# Patient Record
Sex: Female | Born: 1960 | Race: White | Hispanic: No | Marital: Married | Smoking: Never smoker
Health system: Southern US, Community
[De-identification: ages and names within clinical notes are randomized; demographics above are authoritative.]

## PROBLEM LIST (undated history)

## (undated) DIAGNOSIS — M1612 Unilateral primary osteoarthritis, left hip: Secondary | ICD-10-CM

## (undated) DIAGNOSIS — R42 Dizziness and giddiness: Secondary | ICD-10-CM

## (undated) DIAGNOSIS — R112 Nausea with vomiting, unspecified: Secondary | ICD-10-CM

## (undated) DIAGNOSIS — I10 Essential (primary) hypertension: Secondary | ICD-10-CM

## (undated) DIAGNOSIS — Z87442 Personal history of urinary calculi: Secondary | ICD-10-CM

## (undated) DIAGNOSIS — T4145XA Adverse effect of unspecified anesthetic, initial encounter: Secondary | ICD-10-CM

## (undated) DIAGNOSIS — M2341 Loose body in knee, right knee: Secondary | ICD-10-CM

## (undated) DIAGNOSIS — T8859XA Other complications of anesthesia, initial encounter: Secondary | ICD-10-CM

## (undated) DIAGNOSIS — M1990 Unspecified osteoarthritis, unspecified site: Secondary | ICD-10-CM

## (undated) DIAGNOSIS — Z9889 Other specified postprocedural states: Secondary | ICD-10-CM

## (undated) HISTORY — PX: COLONOSCOPY: SHX174

## (undated) HISTORY — PX: TONSILLECTOMY: SUR1361

---

## 1999-06-12 ENCOUNTER — Other Ambulatory Visit: Admission: RE | Admit: 1999-06-12 | Discharge: 1999-06-12 | Payer: Self-pay | Admitting: Obstetrics and Gynecology

## 2000-06-21 ENCOUNTER — Other Ambulatory Visit: Admission: RE | Admit: 2000-06-21 | Discharge: 2000-06-21 | Payer: Self-pay | Admitting: *Deleted

## 2001-07-02 ENCOUNTER — Other Ambulatory Visit: Admission: RE | Admit: 2001-07-02 | Discharge: 2001-07-02 | Payer: Self-pay | Admitting: Obstetrics and Gynecology

## 2002-04-28 ENCOUNTER — Encounter: Admission: RE | Admit: 2002-04-28 | Discharge: 2002-04-28 | Payer: Self-pay | Admitting: Family Medicine

## 2002-04-28 ENCOUNTER — Encounter: Payer: Self-pay | Admitting: Family Medicine

## 2003-04-22 ENCOUNTER — Other Ambulatory Visit: Admission: RE | Admit: 2003-04-22 | Discharge: 2003-04-22 | Payer: Self-pay | Admitting: Obstetrics and Gynecology

## 2005-04-03 ENCOUNTER — Ambulatory Visit: Payer: Self-pay | Admitting: Family Medicine

## 2006-06-15 ENCOUNTER — Encounter (INDEPENDENT_AMBULATORY_CARE_PROVIDER_SITE_OTHER): Payer: Self-pay | Admitting: Internal Medicine

## 2006-06-15 LAB — CONVERTED CEMR LAB: Pap Smear: NORMAL

## 2006-08-22 ENCOUNTER — Ambulatory Visit: Payer: Self-pay | Admitting: Family Medicine

## 2006-08-22 LAB — CONVERTED CEMR LAB
ALT: 27 units/L (ref 0–40)
AST: 26 units/L (ref 0–37)
Albumin: 4.4 g/dL (ref 3.5–5.2)
Alkaline Phosphatase: 58 units/L (ref 39–117)
BUN: 9 mg/dL (ref 6–23)
CO2: 29 meq/L (ref 19–32)
Calcium: 9.5 mg/dL (ref 8.4–10.5)
Chloride: 99 meq/L (ref 96–112)
Creatinine, Ser: 0.9 mg/dL (ref 0.4–1.2)
GFR calc Af Amer: 87 mL/min
GFR calc non Af Amer: 72 mL/min
Glucose, Bld: 86 mg/dL (ref 70–99)
Potassium: 4.1 meq/L (ref 3.5–5.1)
Sodium: 138 meq/L (ref 135–145)
TSH: 2.05 microintl units/mL (ref 0.35–5.50)
Total Bilirubin: 0.9 mg/dL (ref 0.3–1.2)
Total Protein: 7 g/dL (ref 6.0–8.3)

## 2008-02-24 LAB — HM DEXA SCAN

## 2008-03-24 ENCOUNTER — Encounter (INDEPENDENT_AMBULATORY_CARE_PROVIDER_SITE_OTHER): Payer: Self-pay | Admitting: Internal Medicine

## 2008-03-24 DIAGNOSIS — H8110 Benign paroxysmal vertigo, unspecified ear: Secondary | ICD-10-CM

## 2008-03-24 DIAGNOSIS — R42 Dizziness and giddiness: Secondary | ICD-10-CM | POA: Insufficient documentation

## 2008-03-25 ENCOUNTER — Encounter (INDEPENDENT_AMBULATORY_CARE_PROVIDER_SITE_OTHER): Payer: Self-pay | Admitting: Internal Medicine

## 2008-03-25 ENCOUNTER — Ambulatory Visit: Payer: Self-pay | Admitting: Family Medicine

## 2008-03-25 DIAGNOSIS — M064 Inflammatory polyarthropathy: Secondary | ICD-10-CM | POA: Insufficient documentation

## 2008-03-26 LAB — CONVERTED CEMR LAB
BUN: 12 mg/dL (ref 6–23)
Basophils Absolute: 0 10*3/uL (ref 0.0–0.1)
Basophils Relative: 0.3 % (ref 0.0–3.0)
CO2: 28 meq/L (ref 19–32)
Calcium: 9.6 mg/dL (ref 8.4–10.5)
Chloride: 103 meq/L (ref 96–112)
Creatinine, Ser: 0.8 mg/dL (ref 0.4–1.2)
Eosinophils Absolute: 0.2 10*3/uL (ref 0.0–0.7)
Eosinophils Relative: 2.7 % (ref 0.0–5.0)
GFR calc Af Amer: 99 mL/min
GFR calc non Af Amer: 82 mL/min
Glucose, Bld: 78 mg/dL (ref 70–99)
HCT: 39.7 % (ref 36.0–46.0)
Hemoglobin: 13.8 g/dL (ref 12.0–15.0)
Lymphocytes Relative: 22 % (ref 12.0–46.0)
MCHC: 34.8 g/dL (ref 30.0–36.0)
MCV: 91.5 fL (ref 78.0–100.0)
Monocytes Absolute: 0.7 10*3/uL (ref 0.1–1.0)
Monocytes Relative: 11.6 % (ref 3.0–12.0)
Neutro Abs: 3.7 10*3/uL (ref 1.4–7.7)
Neutrophils Relative %: 63.4 % (ref 43.0–77.0)
Platelets: 226 10*3/uL (ref 150–400)
Potassium: 4.5 meq/L (ref 3.5–5.1)
RBC: 4.34 M/uL (ref 3.87–5.11)
RDW: 13.1 % (ref 11.5–14.6)
Rheumatoid fact SerPl-aCnc: <20 intl units/mL — ABNORMAL LOW (ref 0.0–20.0)
Sed Rate: 7 mm/hr (ref 0–22)
Sodium: 137 meq/L (ref 135–145)
WBC: 5.9 10*3/uL (ref 4.5–10.5)

## 2009-04-29 ENCOUNTER — Ambulatory Visit: Payer: Self-pay | Admitting: Internal Medicine

## 2010-01-02 ENCOUNTER — Ambulatory Visit: Payer: Self-pay | Admitting: Internal Medicine

## 2010-04-07 ENCOUNTER — Ambulatory Visit: Payer: Self-pay | Admitting: Family Medicine

## 2010-04-19 ENCOUNTER — Encounter: Payer: Self-pay | Admitting: Family Medicine

## 2010-04-21 LAB — HM MAMMOGRAPHY: HM Mammogram: NORMAL

## 2010-08-15 NOTE — Assessment & Plan Note (Signed)
Summary: BEDSOLE FLU SHOT/RBH   Nurse Visit   Allergies: 1)  ! Sulfa  Immunizations Administered:  Influenza Vaccine # 1:    Vaccine Type: Fluvax 3+    Site: left deltoid    Mfr: GlaxoSmithKline    Dose: 0.5 ml    Route: IM    Given by: Mervin Hack CMA (AAMA)    Exp. Date: 01/13/2011    Lot #: [id number]    VIS given: 02/07/10 version given April 07, 2010.  Flu Vaccine Consent Questions:    Do you have a history of severe allergic reactions to this vaccine? no    Any prior history of allergic reactions to egg and/or gelatin? no    Do you have a sensitivity to the preservative Thimersol? no    Do you have a past history of Guillan-Barre Syndrome? no    Do you currently have an acute febrile illness? no    Have you ever had a severe reaction to latex? no    Vaccine information given and explained to patient? yes    Are you currently pregnant? no  Orders Added: 1)  Flu Vaccine 22yrs + [90658] 2)  Admin 1st Vaccine [14782]

## 2010-08-15 NOTE — Miscellaneous (Signed)
Summary: Tetanus/Td Vaccine   Clinical Lists Changes  Orders: Added new Service order of Tdap => 49yrs IM (16109) - Signed Added new Service order of Admin 1st Vaccine (60454) - Signed Added new Service order of Admin 1st Vaccine [facility]) [phone]) - Signed Observations: Added new observation of TD BOOST VIS: 06/03/07 version given January 02, 2010. (01/02/2010 16:05) Added new observation of TD BOOSTERLO: [id number] (01/02/2010 16:05) Added new observation of TD BOOST EXP: 10/07/2011 (01/02/2010 16:05) Added new observation of TD BOOSTERBY: Tillman Abide, MD (01/02/2010 16:05) Added new observation of TD BOOSTERRT: IM (01/02/2010 16:05) Added new observation of TDBOOSTERDSE: 0.5 ml (01/02/2010 16:05) Added new observation of TD BOOSTERMF: GlaxoSmithKline (01/02/2010 16:05) Added new observation of TD BOOST SIT: left deltoid (01/02/2010 16:05) Added new observation of TD BOOSTER: Tdap (01/02/2010 16:05)      Tetanus/Td Vaccine    Vaccine Type: Tdap    Site: left deltoid    Mfr: GlaxoSmithKline    Dose: 0.5 ml    Route: IM    Given by: Tillman Abide, MD    Exp. Date: 10/07/2011    Lot #: [id number]    VIS given: 06/03/07 version given January 02, 2010.

## 2010-11-14 ENCOUNTER — Encounter: Payer: Self-pay | Admitting: Family Medicine

## 2010-11-15 ENCOUNTER — Institutional Professional Consult (permissible substitution): Payer: Self-pay | Admitting: Internal Medicine

## 2010-11-15 ENCOUNTER — Telehealth: Payer: Self-pay | Admitting: *Deleted

## 2010-11-15 ENCOUNTER — Ambulatory Visit (INDEPENDENT_AMBULATORY_CARE_PROVIDER_SITE_OTHER): Payer: BC Managed Care – PPO | Admitting: Family Medicine

## 2010-11-15 ENCOUNTER — Ambulatory Visit (INDEPENDENT_AMBULATORY_CARE_PROVIDER_SITE_OTHER)
Admission: RE | Admit: 2010-11-15 | Discharge: 2010-11-15 | Disposition: A | Discharge status: Home / Self Care | Payer: BC Managed Care – PPO | Source: Ambulatory Visit | Attending: Family Medicine | Admitting: Family Medicine

## 2010-11-15 ENCOUNTER — Encounter: Payer: Self-pay | Admitting: Family Medicine

## 2010-11-15 DIAGNOSIS — S76019A Strain of muscle, fascia and tendon of unspecified hip, initial encounter: Secondary | ICD-10-CM | POA: Insufficient documentation

## 2010-11-15 DIAGNOSIS — M25559 Pain in unspecified hip: Secondary | ICD-10-CM | POA: Insufficient documentation

## 2010-11-15 DIAGNOSIS — S76219A Strain of adductor muscle, fascia and tendon of unspecified thigh, initial encounter: Secondary | ICD-10-CM | POA: Insufficient documentation

## 2010-11-15 DIAGNOSIS — IMO0002 Reserved for concepts with insufficient information to code with codable children: Secondary | ICD-10-CM

## 2010-11-15 MED ORDER — DICLOFENAC SODIUM 1.5 % TD SOLN: TRANSDERMAL | Status: DC

## 2010-11-15 NOTE — Patient Instructions (Signed)
REFERRAL: GO THE THE FRONT ROOM AT THE ENTRANCE OF OUR CLINIC, NEAR CHECK IN. ASK FOR MARION. SHE WILL HELP YOU SET UP YOUR REFERRAL. DATE: TIME:  Follow-up in 6 weeks.

## 2010-11-15 NOTE — Progress Notes (Signed)
50 year old female: Extremely active, normally very healthy female who presents for evaluation for persistent left-sided anterior hip pain.  Running, had some ITBS, starting in November.  Previously, she was running about 25 miles a week  Went to kinesiology - at the [facility]. [place] - strength equal on anterior and posterior lower extremity testing. No deficits appreciated in strength  Has not run in 6 weeks. Can do ellipitical and bike.  20-25 miles a week 5-10 k, has never completed and a half marathon or marathon  Pain with sleeping.  Sleep on back No numbness Knees are sometimes bothersome, particularly with her prior appendectomy BS, however now her primary pain is at the anterior hip  Ellipitical a few times a week Stair stepper - off for a few months. She did this very hard when she couldn't run when her ITB was flared up  Cannot stretch any more. Some pain. Father has a history of bone cancer.   Has done NSAIDS, Tylenol, attempted to stretch, alter exercises on her own.  Patient Active Problem List  Diagnoses  . MIGRAINE HEADACHE  . BENIGN POSITIONAL VERTIGO  . LICHEN PLANUS  . UNSPECIFIED INFLAMMATORY POLYARTHROPATHY  . VERTIGO, INTERMITTENT  . Groin strain  . Strain of hip flexor  . Hip pain   No past medical history on file. Past Surgical History  Procedure Date  . Ovarian cyst surgery 03-2004    right   History  Substance Use Topics  . Smoking status: Never Smoker   . Smokeless tobacco: Not on file  . Alcohol Use: Not on file   Family History  Problem Relation Age of Onset  . Diabetes Mother   . Goiter Mother   . Colon polyps Father   . Addison's disease Sister    Allergies  Allergen Reactions  . Sulfonamide Derivatives     REACTION: rash   Current Outpatient Prescriptions on File Prior to Visit  Medication Sig Dispense Refill  . calcium carbonate (TUMS - DOSED IN MG ELEMENTAL CALCIUM) 500 MG chewable tablet Chew 1 tablet by mouth 2 (two) times  daily.        . Cholecalciferol (VITAMIN D3) 10000 UNITS capsule Take 10,000 Units by mouth once a week.        . Chondroitin Sulfate A 150 MG CAPS Take 1 capsule by mouth daily.        . flintstones complete (FLINTSTONES) 60 MG chewable tablet Chew 1 tablet by mouth daily.        . Glucosamine HCl-Glucosamin SO4 (GLUCOSAMINE COMPLEX) 200-300 MG TABS Take 1 tablet by mouth daily.        Marland Kitchen ibuprofen (ADVIL,MOTRIN) 200 MG tablet Take 800 mg by mouth every 8 (eight) hours as needed.         REVIEW OF SYSTEMS  GEN: No fevers, chills. Nontoxic. Primarily MSK c/o today. MSK: Detailed in the HPI GI: tolerating PO intake without difficulty Neuro: No numbness, parasthesias, or tingling associated. Otherwise the pertinent positives of the ROS are noted above.   GEN: Well-developed,well-nourished,in no acute distress; alert,appropriate and cooperative throughout examination HEENT: Normocephalic and atraumatic without obvious abnormalities. Ears, externally no deformities PULM: Breathing comfortably in no respiratory distress EXT: No clubbing, cyanosis, or edema PSYCH: Normally interactive. Cooperative during the interview. Pleasant. Friendly and conversant. Not anxious or depressed appearing. Normal, full affect.  HIP EXAM: SIDE: L ROM: Abduction, Flexion, Internal and External range of motion: marginally restricted internal and external rotation. She does have some pain anteriorly  with both terminal motions. This is at her point of maximal tenderness Pain with terminal IROM and EROM: as above.  Anterior hip, flexion insertion, just distal to the ASIS, there is some pain GTB: NT SLR: NEG Knees: No effusion FABER: NT REVERSE FABER: NT, neg Piriformis: NT at direct palpation Str: flexion: 4/5, true flexion and with hip turned out 4/5 - pain abduction: 5/5 adduction: 5/5  A/P: hip pain, hip abductor strain, and hip abductor strain. More true overuse tendinopathy of multiple compartments of the  hip flexure and hip abductor region.  Probable insertional tendinopathy.  At this point, we reviewed plan of care. We'll initiate physical therapy consult. Directed home exercise program. Continue oral anti-inflammatories and NSAID topical. Recheck in 6 weeks

## 2010-11-15 NOTE — Telephone Encounter (Signed)
Prior auth needed for pennsaid, form is on your desk.

## 2010-11-16 NOTE — Telephone Encounter (Signed)
Form faxed to [facility].

## 2010-11-17 NOTE — Telephone Encounter (Signed)
Prior auth given for pennsaid, approval letter signed by Dr. Patsy Lager, placed up front for scanning.

## 2011-04-26 ENCOUNTER — Ambulatory Visit (INDEPENDENT_AMBULATORY_CARE_PROVIDER_SITE_OTHER): Payer: BC Managed Care – PPO

## 2011-04-26 DIAGNOSIS — Z23 Encounter for immunization: Secondary | ICD-10-CM

## 2011-06-04 ENCOUNTER — Encounter: Payer: Self-pay | Admitting: Internal Medicine

## 2011-10-22 ENCOUNTER — Other Ambulatory Visit: Payer: Self-pay | Admitting: Family Medicine

## 2011-10-22 MED ORDER — DICLOFENAC SODIUM 1.5 % TD SOLN: TRANSDERMAL | Status: DC

## 2011-10-23 ENCOUNTER — Telehealth: Payer: Self-pay

## 2011-10-23 NOTE — Telephone Encounter (Signed)
Will do!

## 2011-10-23 NOTE — Telephone Encounter (Signed)
Received faxed from from [facility] requesting prior auth for Pennsaid drops. Form from [employer] [facility] is in your in box to complete and fax back.

## 2011-10-25 NOTE — Telephone Encounter (Signed)
Completed PA request faxed to [phone]. Approval letter received and faxed to [facility] and is on Dr [employer] desk for signature then sent to be scanned.

## 2012-01-01 ENCOUNTER — Encounter: Payer: Self-pay | Admitting: Family Medicine

## 2012-01-01 ENCOUNTER — Ambulatory Visit (INDEPENDENT_AMBULATORY_CARE_PROVIDER_SITE_OTHER): Payer: BC Managed Care – PPO | Admitting: Family Medicine

## 2012-01-01 VITALS — BP 140/100 | HR 64 | Temp 97.9°F | Wt 134.0 lb

## 2012-01-01 DIAGNOSIS — R030 Elevated blood-pressure reading, without diagnosis of hypertension: Secondary | ICD-10-CM

## 2012-01-01 DIAGNOSIS — I10 Essential (primary) hypertension: Secondary | ICD-10-CM | POA: Insufficient documentation

## 2012-01-01 LAB — CBC WITH DIFFERENTIAL/PLATELET
Basophils Relative: 0.5 % (ref 0.0–3.0)
Eosinophils Absolute: 0.1 10*3/uL (ref 0.0–0.7)
Eosinophils Relative: 2.6 % (ref 0.0–5.0)
HCT: 37.8 % (ref 36.0–46.0)
Lymphs Abs: 1.4 10*3/uL (ref 0.7–4.0)
MCHC: 33.8 g/dL (ref 30.0–36.0)
MCV: 91.8 fl (ref 78.0–100.0)
Monocytes Absolute: 0.6 10*3/uL (ref 0.1–1.0)
Neutrophils Relative %: 61.2 % (ref 43.0–77.0)
Platelets: 221 10*3/uL (ref 150.0–400.0)
RBC: 4.12 Mil/uL (ref 3.87–5.11)
WBC: 5.7 10*3/uL (ref 4.5–10.5)

## 2012-01-01 LAB — COMPREHENSIVE METABOLIC PANEL
ALT: 14 U/L (ref 0–35)
AST: 23 U/L (ref 0–37)
Albumin: 4.2 g/dL (ref 3.5–5.2)
Alkaline Phosphatase: 44 U/L (ref 39–117)
BUN: 10 mg/dL (ref 6–23)
Calcium: 9 mg/dL (ref 8.4–10.5)
Chloride: 95 mEq/L — ABNORMAL LOW (ref 96–112)
Potassium: 3.7 mEq/L (ref 3.5–5.1)

## 2012-01-01 LAB — POCT URINALYSIS DIPSTICK
Ketones, UA: NEGATIVE
Protein, UA: NEGATIVE
Spec Grav, UA: <=1.005
Urobilinogen, UA: NEGATIVE
pH, UA: 7.5

## 2012-01-01 MED ORDER — HYDROCHLOROTHIAZIDE 25 MG PO TABS: 25.0000 mg | ORAL_TABLET | Freq: Every day | ORAL | Status: DC

## 2012-01-01 NOTE — Progress Notes (Signed)
  Subjective:    Patient ID: Sharon Riddle, female    DOB: 05-10-1961, 51 y.o.   MRN: [id number]  HPI  New onset blood pressure elevations: Ever since taking diclofenac cream TID (10/2011) she has noted BPs elevated at home.  She first noted this 1 week ago.. Saw 6 lb weight gain from fluid. Hand swelling and fluid under eyes. BP was 160/100. Mild dull headache. In past she had same reaction to vioxx before it was taken of the market.   BP has decreased some since being off diclofenac pain.  Feels well otherwise. No abdominal, No N/V. No chest pain, no SOB.  No family history of HTN, heart disease. Mother with DM type 1, no  Big kidney issues.   Review of Systems  Constitutional: Negative for fever and fatigue.  HENT: Negative for ear pain.   Eyes: Negative for pain.  Respiratory: Negative for chest tightness and shortness of breath.   Cardiovascular: Negative for chest pain, palpitations and leg swelling.  Gastrointestinal: Negative for abdominal pain.  Genitourinary: Negative for dysuria.  Musculoskeletal: Positive for joint swelling and arthralgias.  Neurological: Positive for headaches. Negative for syncope.  Psychiatric/Behavioral: The patient is not nervous/anxious.        Objective:   Physical Exam  Constitutional: Vital signs are normal. She appears well-developed and well-nourished. She is cooperative.  Non-toxic appearance. She does not appear ill. No distress.  HENT:  Head: Normocephalic.  Right Ear: Hearing, tympanic membrane, external ear and ear canal normal. Tympanic membrane is not erythematous, not retracted and not bulging.  Left Ear: Hearing, tympanic membrane, external ear and ear canal normal. Tympanic membrane is not erythematous, not retracted and not bulging.  Nose: No mucosal edema or rhinorrhea. Right sinus exhibits no maxillary sinus tenderness and no frontal sinus tenderness. Left sinus exhibits no maxillary sinus tenderness and no frontal sinus  tenderness.  Mouth/Throat: Uvula is midline, oropharynx is clear and moist and mucous membranes are normal.  Eyes: Conjunctivae, EOM and lids are normal. Pupils are equal, round, and reactive to light. No foreign bodies found.  Fundoscopic exam:      The right eye shows no arteriolar narrowing, no AV nicking and no papilledema.       The left eye shows no arteriolar narrowing, no AV nicking and no papilledema.  Neck: Trachea normal and normal range of motion. Neck supple. Carotid bruit is not present. No mass and no thyromegaly present.  Cardiovascular: Normal rate, regular rhythm, S1 normal, S2 normal, normal heart sounds, intact distal pulses and normal pulses.  Exam reveals no gallop and no friction rub.   No murmur heard. Pulmonary/Chest: Effort normal and breath sounds normal. Not tachypneic. No respiratory distress. She has no decreased breath sounds. She has no wheezes. She has no rhonchi. She has no rales.  Abdominal: Soft. Normal appearance and bowel sounds are normal. There is no tenderness.  Neurological: She is alert.  Skin: Skin is warm, dry and intact. No rash noted.  Psychiatric: Her speech is normal and behavior is normal. Judgment and thought content normal. Her mood appears not anxious. Cognition and memory are normal. She does not exhibit a depressed mood.          Assessment & Plan:

## 2012-01-01 NOTE — Assessment & Plan Note (Signed)
Likely due to reaction to diclofenac topical.  EKG nml.  UA clear.  Normal physical exam except diffuse swelling. No suggestion from symptomatology of pheochromocytoma. Will eval with labs including liver, kidney, thyroid check to rule out other secondary causes. No evidence of end organ damage.  In meantime, will start HCTZ to help with swelling and to lower BP. May be able to wean off of this the further she is from stopping diclofenac.

## 2012-01-01 NOTE — Patient Instructions (Addendum)
Start HCTZ daily in mornings. Continue to follow BPs at home. Call if remaining elevated despite medication. We will call you with lab results. If normal we will give more time for possible reaction to diclofenac to resolve, can wean down off HCTZ if able. Go to ER if chest pain or severe shortness of breath.

## 2012-01-03 ENCOUNTER — Ambulatory Visit: Payer: BC Managed Care – PPO | Admitting: Family Medicine

## 2012-04-03 ENCOUNTER — Ambulatory Visit (INDEPENDENT_AMBULATORY_CARE_PROVIDER_SITE_OTHER)
Admission: RE | Admit: 2012-04-03 | Discharge: 2012-04-03 | Disposition: A | Discharge status: Home / Self Care | Payer: BC Managed Care – PPO | Source: Ambulatory Visit | Attending: Family Medicine | Admitting: Family Medicine

## 2012-04-03 ENCOUNTER — Ambulatory Visit (INDEPENDENT_AMBULATORY_CARE_PROVIDER_SITE_OTHER): Payer: BC Managed Care – PPO | Admitting: Family Medicine

## 2012-04-03 ENCOUNTER — Ambulatory Visit (HOSPITAL_COMMUNITY): Payer: BC Managed Care – PPO

## 2012-04-03 ENCOUNTER — Encounter: Payer: Self-pay | Admitting: Family Medicine

## 2012-04-03 VITALS — BP 130/90 | HR 72 | Temp 98.7°F | Ht 65.0 in | Wt 131.5 lb

## 2012-04-03 DIAGNOSIS — M2390 Unspecified internal derangement of unspecified knee: Secondary | ICD-10-CM

## 2012-04-03 DIAGNOSIS — M25561 Pain in right knee: Secondary | ICD-10-CM

## 2012-04-03 DIAGNOSIS — M9320 Osteochondritis dissecans of unspecified site: Secondary | ICD-10-CM

## 2012-04-03 DIAGNOSIS — M2340 Loose body in knee, unspecified knee: Secondary | ICD-10-CM

## 2012-04-03 DIAGNOSIS — M93269 Osteochondritis dissecans, unspecified knee: Secondary | ICD-10-CM

## 2012-04-03 DIAGNOSIS — Z23 Encounter for immunization: Secondary | ICD-10-CM

## 2012-04-03 DIAGNOSIS — M25569 Pain in unspecified knee: Secondary | ICD-10-CM

## 2012-04-03 NOTE — Addendum Note (Signed)
Addended by: Hannah Beat on: 04/03/2012 02:32 PM   Modules accepted: Orders

## 2012-04-03 NOTE — Progress Notes (Signed)
Nature conservation officer at [facility] [address] [place] [address] Phone: [phone] Fax: [phone]  Date:  04/03/2012   Name:  Sharon Riddle   DOB:  Jul 08, 1961   MRN:  [id number] Gender: female Age: 51 y.o.  PCP:  Kerby Nora, MD    Chief Complaint: Knee Pain   History of Present Illness:  Sharon Riddle is a 51 y.o. very pleasant female patient who presents with the following:  Very pleasant patient who I know well who presents with right-sided knee pain that has been ongoing intermittently and worsening over approaching the last 3 decades, however her symptoms significantly changed about 9 months ago when she had what she describes as a locking up of her joint that lasted for approximately 6 months. She was unable to fully extend her knee and lack at least 10-15 of extension at the time. 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 She describes a medial dislocation.  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  While on a trip, she also sustained an injury where her knee will struck and she twisted her right knee as well.  She does have some intermittent swelling.  51 yo knee, had an arthroscopy with a significant injury at work. ITB recovered, and christmas   Was locked in her right knee, but was able to move it after many months.  Has been dislocating. Stretch a lot at night, and kneecap will ive out. Cannot really give out. Every step. Hinged -- brace.   Past Medical History, Surgical History, Social History, Family History, Problem List, Medications, and Allergies have been reviewed and updated if relevant.  Current Outpatient Prescriptions on File Prior to Visit  Medication Sig Dispense Refill  . aspirin 81 MG tablet Take 81 mg by mouth daily.      . calcium carbonate (TUMS - DOSED IN MG  ELEMENTAL CALCIUM) 500 MG chewable tablet Chew 1 tablet by mouth 2 (two) times daily.        . Cholecalciferol (VITAMIN D3) 10000 UNITS capsule Take 10,000 Units by mouth once a week.        . Chondroitin Sulfate A 150 MG CAPS Take 1 capsule by mouth daily.        . Glucosamine HCl-Glucosamin SO4 (GLUCOSAMINE COMPLEX) 200-300 MG TABS Take 1 tablet by mouth daily.        . hydrochlorothiazide (HYDRODIURIL) 25 MG tablet Take 1 tablet (25 mg total) by mouth daily.  30 tablet  3    Review of Systems:  GEN: No fevers, chills. Nontoxic. Primarily MSK c/o today. MSK: Detailed in the HPI GI: tolerating PO intake without difficulty Neuro: No numbness, parasthesias, or tingling associated. Otherwise the pertinent positives of the ROS are noted above.    Physical Examination: Filed Vitals:   04/03/12 0856  BP: 130/90  Pulse: 72  Temp: 98.7 F (37.1 C)   Filed Vitals:   04/03/12 0856  Height: 5\' 5"  (1.651 m)  Weight: 131 lb 8 oz (59.648 kg)   Body mass index is 21.88 kg/(m^2). Ideal Body Weight: Weight in (lb) to have BMI = 25: 149.9    GEN: WDWN, NAD, Non-toxic, Alert & Oriented x 3 HEENT: Atraumatic, Normocephalic.  Ears and Nose: No external deformity. EXTR: No clubbing/cyanosis/edema NEURO: Normal gait.  PSYCH: Normally  interactive. Conversant. Not depressed or anxious appearing.  Calm demeanor.   Right knee: Lacks 4 of extension. Flexion to 118. Patellar crepitus is noted. Mild pain with loading the patellar facet joints. Stable to varus and valgus stress. Lachman appears negative on my exam. There is some mild anterior translation on anterior drawer compared to the left. Flexion pinch is positive. McMurray's test is positive.  Assessment and Plan:   1. Internal derangement of knee    2. Knee pain, right  DG Knee Bilateral Standing AP, DG Knee 1-2 Views Right, MR Knee Right Wo Contrast   Clinical concern for occult internal derangement of the knee, loose body, or meniscal  tear that is causing mechanical symptoms at locking up of the knee. Obtain an MRI without contrast to evaluate for above. Abnormal x-rays as well as positive McMurray's test and positive flexion pinch testing.  X-rays: AP Bilateral Weight-bearing, Weightbearing Lateral, Sunrise views Indication: knee pain Findings: Notable mild to moderate patellofemoral joint arthritic changes. Joint spaces are fairly well preserved on the weight-bearing AP views. All lateral view, there is a radiopaque structure posteriorly that could represent a loose body. There also appears to be a [place].  Orders Today:  Orders Placed This Encounter  Procedures  . DG Knee Bilateral Standing AP    Standing Status: Future     Number of Occurrences: 1     Standing Expiration Date: 06/03/2013    Order Specific Question:  Preferred imaging location?    Answer:  [facility]    Order Specific Question:  Reason for exam:    Answer:  knee pain  . DG Knee 1-2 Views Right    Standing Status: Future     Number of Occurrences: 1     Standing Expiration Date: 06/03/2013    Order Specific Question:  Preferred imaging location?    Answer:  [facility]    Order Specific Question:  Reason for exam:    Answer:  knee pain  . MR Knee Right Wo Contrast    Standing Status: Future     Number of Occurrences:      Standing Expiration Date: 06/03/2013    Order Specific Question:  Does the patient have a pacemaker, internal devices, implants, aneury    Answer:  No    Order Specific Question:  Preferred imaging location?    Answer:  [facility]    Order Specific Question:  Reason for exam:    Answer:  knee pain, worry for meniscal pathology vs. loose body    Updated Medication List: (Includes new medications, updates to list, dose adjustments) Outpatient Encounter Prescriptions as of 04/03/2012  Medication Sig Dispense Refill  . aspirin 81 MG tablet Take 81 mg by mouth daily.      . calcium carbonate (TUMS  - DOSED IN MG ELEMENTAL CALCIUM) 500 MG chewable tablet Chew 1 tablet by mouth 2 (two) times daily.        . Cholecalciferol (VITAMIN D3) 10000 UNITS capsule Take 10,000 Units by mouth once a week.        . Chondroitin Sulfate A 150 MG CAPS Take 1 capsule by mouth daily.        . Glucosamine HCl-Glucosamin SO4 (GLUCOSAMINE COMPLEX) 200-300 MG TABS Take 1 tablet by mouth daily.        . hydrochlorothiazide (HYDRODIURIL) 25 MG tablet Take 1 tablet (25 mg total) by mouth daily.  30 tablet  3    Medications Discontinued: There are no discontinued  medications.   Hannah Beat, MD

## 2012-04-03 NOTE — Patient Instructions (Addendum)
REFERRAL: GO THE THE FRONT ROOM AT THE ENTRANCE OF OUR CLINIC, NEAR CHECK IN. ASK FOR MARION. SHE WILL HELP YOU SET UP YOUR REFERRAL. DATE: TIME:  

## 2012-04-10 ENCOUNTER — Other Ambulatory Visit (HOSPITAL_COMMUNITY): Payer: Self-pay | Admitting: Orthopedic Surgery

## 2012-04-10 DIAGNOSIS — M2340 Loose body in knee, unspecified knee: Secondary | ICD-10-CM

## 2012-04-12 ENCOUNTER — Ambulatory Visit (HOSPITAL_COMMUNITY)
Admission: RE | Admit: 2012-04-12 | Discharge: 2012-04-12 | Disposition: A | Discharge status: Home / Self Care | Payer: BC Managed Care – PPO | Source: Ambulatory Visit | Attending: Orthopedic Surgery | Admitting: Orthopedic Surgery

## 2012-04-12 DIAGNOSIS — M2340 Loose body in knee, unspecified knee: Secondary | ICD-10-CM

## 2012-04-12 DIAGNOSIS — IMO0002 Reserved for concepts with insufficient information to code with codable children: Secondary | ICD-10-CM | POA: Insufficient documentation

## 2012-04-12 DIAGNOSIS — M25469 Effusion, unspecified knee: Secondary | ICD-10-CM | POA: Insufficient documentation

## 2012-04-12 DIAGNOSIS — M1710 Unilateral primary osteoarthritis, unspecified knee: Secondary | ICD-10-CM | POA: Insufficient documentation

## 2012-04-12 DIAGNOSIS — M25569 Pain in unspecified knee: Secondary | ICD-10-CM | POA: Insufficient documentation

## 2012-04-12 DIAGNOSIS — M7120 Synovial cyst of popliteal space [Baker], unspecified knee: Secondary | ICD-10-CM | POA: Insufficient documentation

## 2012-04-16 ENCOUNTER — Encounter (HOSPITAL_BASED_OUTPATIENT_CLINIC_OR_DEPARTMENT_OTHER): Payer: Self-pay | Admitting: *Deleted

## 2012-04-17 ENCOUNTER — Other Ambulatory Visit (INDEPENDENT_AMBULATORY_CARE_PROVIDER_SITE_OTHER): Payer: BC Managed Care – PPO

## 2012-04-17 DIAGNOSIS — Z01818 Encounter for other preprocedural examination: Secondary | ICD-10-CM

## 2012-04-17 LAB — BASIC METABOLIC PANEL
CO2: 28 mEq/L (ref 19–32)
Chloride: 96 mEq/L (ref 96–112)
Creatinine, Ser: 0.7 mg/dL (ref 0.4–1.2)

## 2012-04-18 ENCOUNTER — Encounter (HOSPITAL_BASED_OUTPATIENT_CLINIC_OR_DEPARTMENT_OTHER): Payer: Self-pay

## 2012-04-18 ENCOUNTER — Ambulatory Visit (HOSPITAL_BASED_OUTPATIENT_CLINIC_OR_DEPARTMENT_OTHER): Payer: BC Managed Care – PPO | Admitting: Anesthesiology

## 2012-04-18 ENCOUNTER — Encounter (HOSPITAL_BASED_OUTPATIENT_CLINIC_OR_DEPARTMENT_OTHER): Payer: Self-pay | Admitting: Anesthesiology

## 2012-04-18 ENCOUNTER — Ambulatory Visit (HOSPITAL_BASED_OUTPATIENT_CLINIC_OR_DEPARTMENT_OTHER)
Admission: RE | Admit: 2012-04-18 | Discharge: 2012-04-18 | Disposition: A | Discharge status: Home / Self Care | Payer: BC Managed Care – PPO | Source: Ambulatory Visit | Attending: Orthopedic Surgery | Admitting: Orthopedic Surgery

## 2012-04-18 ENCOUNTER — Encounter (HOSPITAL_BASED_OUTPATIENT_CLINIC_OR_DEPARTMENT_OTHER)
Admission: RE | Disposition: A | Discharge status: Home / Self Care | Payer: Self-pay | Source: Ambulatory Visit | Attending: Orthopedic Surgery

## 2012-04-18 DIAGNOSIS — I10 Essential (primary) hypertension: Secondary | ICD-10-CM | POA: Insufficient documentation

## 2012-04-18 DIAGNOSIS — M2341 Loose body in knee, right knee: Secondary | ICD-10-CM

## 2012-04-18 DIAGNOSIS — M2340 Loose body in knee, unspecified knee: Secondary | ICD-10-CM | POA: Insufficient documentation

## 2012-04-18 HISTORY — PX: KNEE ARTHROSCOPY: SHX127

## 2012-04-18 HISTORY — DX: Unspecified osteoarthritis, unspecified site: M19.90

## 2012-04-18 HISTORY — DX: Essential (primary) hypertension: I10

## 2012-04-18 HISTORY — DX: Loose body in knee, right knee: M23.41

## 2012-04-18 SURGERY — ARTHROSCOPY, KNEE
Anesthesia: General | Site: Knee | Laterality: Right | Wound class: Clean

## 2012-04-18 MED ORDER — SCOPOLAMINE 1 MG/3DAYS TD PT72
1.0000 | MEDICATED_PATCH | TRANSDERMAL | Status: DC
  Administered 2012-04-18: 1.5 mg via TRANSDERMAL

## 2012-04-18 MED ORDER — PROPOFOL 10 MG/ML IV BOLUS
INTRAVENOUS | Status: DC | PRN
  Administered 2012-04-18: 150 mg via INTRAVENOUS
  Administered 2012-04-18: 30 mg via INTRAVENOUS

## 2012-04-18 MED ORDER — ACETAMINOPHEN 10 MG/ML IV SOLN
1000.0000 mg | Freq: Once | INTRAVENOUS | Status: AC
  Administered 2012-04-18 (×2): 1000 mg via INTRAVENOUS

## 2012-04-18 MED ORDER — GLYCOPYRROLATE 0.2 MG/ML IJ SOLN
INTRAMUSCULAR | Status: DC | PRN
  Administered 2012-04-18: 0.2 mg via INTRAVENOUS

## 2012-04-18 MED ORDER — CEFAZOLIN SODIUM-DEXTROSE 2-3 GM-% IV SOLR
INTRAVENOUS | Status: DC | PRN
  Administered 2012-04-18: 2 g via INTRAVENOUS

## 2012-04-18 MED ORDER — MIDAZOLAM HCL 5 MG/5ML IJ SOLN
INTRAMUSCULAR | Status: DC | PRN
  Administered 2012-04-18: 2 mg via INTRAVENOUS

## 2012-04-18 MED ORDER — METHOCARBAMOL 500 MG PO TABS: 500.0000 mg | ORAL_TABLET | Freq: Four times a day (QID) | ORAL | Status: DC

## 2012-04-18 MED ORDER — HYDRALAZINE HCL 20 MG/ML IJ SOLN
INTRAMUSCULAR | Status: DC | PRN
  Administered 2012-04-18: 5 mg via INTRAVENOUS

## 2012-04-18 MED ORDER — OXYCODONE-ACETAMINOPHEN 5-325 MG PO TABS: 1.0000 | ORAL_TABLET | Freq: Four times a day (QID) | ORAL | Status: DC | PRN

## 2012-04-18 MED ORDER — DEXAMETHASONE SODIUM PHOSPHATE 4 MG/ML IJ SOLN
INTRAMUSCULAR | Status: DC | PRN
  Administered 2012-04-18: 10 mg via INTRAVENOUS

## 2012-04-18 MED ORDER — PROMETHAZINE HCL 25 MG/ML IJ SOLN
6.2500 mg | Freq: Four times a day (QID) | INTRAMUSCULAR | Status: DC | PRN
  Administered 2012-04-18: 6.25 mg via INTRAVENOUS

## 2012-04-18 MED ORDER — PROMETHAZINE HCL 25 MG PO TABS: 25.0000 mg | ORAL_TABLET | Freq: Four times a day (QID) | ORAL | Status: DC | PRN

## 2012-04-18 MED ORDER — BUPIVACAINE HCL (PF) 0.5 % IJ SOLN
INTRAMUSCULAR | Status: DC | PRN
  Administered 2012-04-18: 20 mL

## 2012-04-18 MED ORDER — LACTATED RINGERS IV SOLN
INTRAVENOUS | Status: DC
  Administered 2012-04-18: 08:00:00 via INTRAVENOUS
  Administered 2012-04-18: 20 mL/h via INTRAVENOUS

## 2012-04-18 MED ORDER — ONDANSETRON HCL 4 MG/2ML IJ SOLN
INTRAMUSCULAR | Status: DC | PRN
  Administered 2012-04-18: 4 mg via INTRAVENOUS

## 2012-04-18 MED ORDER — FENTANYL CITRATE 0.05 MG/ML IJ SOLN
INTRAMUSCULAR | Status: DC | PRN
  Administered 2012-04-18: 100 ug via INTRAVENOUS
  Administered 2012-04-18: 50 ug via INTRAVENOUS

## 2012-04-18 MED ORDER — SODIUM CHLORIDE 0.9 % IR SOLN
Status: DC | PRN
  Administered 2012-04-18: 9000 mL

## 2012-04-18 MED ORDER — LIDOCAINE HCL (CARDIAC) 20 MG/ML IV SOLN
INTRAVENOUS | Status: DC | PRN
  Administered 2012-04-18: 60 mg via INTRAVENOUS

## 2012-04-18 SURGICAL SUPPLY — 52 items
APL SKNCLS STERI-STRIP NONHPOA (GAUZE/BANDAGES/DRESSINGS) ×1
BANDAGE ELASTIC 6 VELCRO ST LF (GAUZE/BANDAGES/DRESSINGS) ×2 IMPLANT
BANDAGE ESMARK 6X9 LF (GAUZE/BANDAGES/DRESSINGS) IMPLANT
BENZOIN TINCTURE PRP APPL 2/3 (GAUZE/BANDAGES/DRESSINGS) ×2 IMPLANT
BLADE CUDA 5.5 (BLADE) IMPLANT
BLADE CUDA GRT WHITE 3.5 (BLADE) IMPLANT
BLADE CUDA SHAVER 3.5 (BLADE) IMPLANT
BLADE CUTTER GATOR 3.5 (BLADE) ×2 IMPLANT
BLADE CUTTER MENIS 5.5 (BLADE) IMPLANT
BLADE GREAT WHITE 4.2 (BLADE) IMPLANT
BLADE SURG 15 STRL LF DISP TIS (BLADE) IMPLANT
BLADE SURG 15 STRL SS (BLADE) ×2
BNDG CMPR 9X6 STRL LF SNTH (GAUZE/BANDAGES/DRESSINGS)
BNDG ESMARK 6X9 LF (GAUZE/BANDAGES/DRESSINGS)
BUR OVAL 4.0 (BURR) ×1 IMPLANT
CANISTER OMNI JUG 16 LITER (MISCELLANEOUS) ×2 IMPLANT
CANISTER SUCTION 2500CC (MISCELLANEOUS) IMPLANT
CLOTH BEACON ORANGE TIMEOUT ST (SAFETY) ×2 IMPLANT
CUFF TOURNIQUET SINGLE 34IN LL (TOURNIQUET CUFF) IMPLANT
CUTTER KNOT PUSHER 2-0 FIBERWI (INSTRUMENTS) IMPLANT
CUTTER MENISCUS  4.2MM (BLADE)
CUTTER MENISCUS 4.2MM (BLADE) IMPLANT
DRAPE ARTHROSCOPY W/POUCH 90 (DRAPES) ×2 IMPLANT
DURAPREP 26ML APPLICATOR (WOUND CARE) ×2 IMPLANT
ELECT MENISCUS 165MM 90D (ELECTRODE) IMPLANT
ELECT REM PT RETURN 9FT ADLT (ELECTROSURGICAL)
ELECTRODE REM PT RTRN 9FT ADLT (ELECTROSURGICAL) IMPLANT
GLOVE BIO SURGEON STRL SZ7 (GLOVE) ×1 IMPLANT
GLOVE BIO SURGEON STRL SZ8 (GLOVE) ×2 IMPLANT
GLOVE BIOGEL PI IND STRL 8 (GLOVE) ×2 IMPLANT
GLOVE BIOGEL PI INDICATOR 8 (GLOVE) ×2
GLOVE INDICATOR 7.0 STRL GRN (GLOVE) ×1 IMPLANT
GLOVE ORTHO TXT STRL SZ7.5 (GLOVE) ×2 IMPLANT
GOWN BRE IMP PREV XXLGXLNG (GOWN DISPOSABLE) ×4 IMPLANT
GOWN PREVENTION PLUS XLARGE (GOWN DISPOSABLE) ×1 IMPLANT
HOLDER KNEE FOAM BLUE (MISCELLANEOUS) ×2 IMPLANT
KNEE WRAP E Z 3 GEL PACK (MISCELLANEOUS) ×1 IMPLANT
NDL MENISCAL REPAIR W/EYELT (NEEDLE) IMPLANT
NEEDLE MENISCAL REPAIR DBL ARM (NEEDLE) IMPLANT
NEEDLE MENISCAL REPAIR W/EYELT (NEEDLE) IMPLANT
PACK ARTHROSCOPY DSU (CUSTOM PROCEDURE TRAY) ×2 IMPLANT
PACK BASIN DAY SURGERY FS (CUSTOM PROCEDURE TRAY) ×2 IMPLANT
PENCIL BUTTON HOLSTER BLD 10FT (ELECTRODE) IMPLANT
SET ARTHROSCOPY TUBING (MISCELLANEOUS) ×2
SET ARTHROSCOPY TUBING LN (MISCELLANEOUS) ×1 IMPLANT
SLEEVE SCD COMPRESS KNEE MED (MISCELLANEOUS) IMPLANT
SPONGE GAUZE 4X4 12PLY (GAUZE/BANDAGES/DRESSINGS) ×2 IMPLANT
STRIP CLOSURE SKIN 1/2X4 (GAUZE/BANDAGES/DRESSINGS) ×2 IMPLANT
SUT MNCRL AB 4-0 PS2 18 (SUTURE) IMPLANT
TOWEL OR 17X24 6PK STRL BLUE (TOWEL DISPOSABLE) ×2 IMPLANT
WAND STAR VAC 90 (SURGICAL WAND) ×1 IMPLANT
WATER STERILE IRR 1000ML POUR (IV SOLUTION) ×2 IMPLANT

## 2012-04-18 NOTE — Anesthesia Preprocedure Evaluation (Signed)
Anesthesia Evaluation  Patient identified by MRN, date of birth, ID band Patient awake    Reviewed: Allergy & Precautions, H&P , NPO status , Patient's Chart, lab work & pertinent test results  Airway Mallampati: II TM Distance: >3 FB Neck ROM: Full    Dental No notable dental hx. (+) Teeth Intact and Dental Advisory Given   Pulmonary neg pulmonary ROS,  breath sounds clear to auscultation  Pulmonary exam normal       Cardiovascular hypertension, On Medications Rhythm:Regular Rate:Normal     Neuro/Psych negative neurological ROS  negative psych ROS   GI/Hepatic negative GI ROS, Neg liver ROS,   Endo/Other  negative endocrine ROS  Renal/GU negative Renal ROS  negative genitourinary   Musculoskeletal   Abdominal   Peds  Hematology negative hematology ROS (+)   Anesthesia Other Findings   Reproductive/Obstetrics negative OB ROS                           Anesthesia Physical Anesthesia Plan  ASA: II  Anesthesia Plan: General   Post-op Pain Management:    Induction: Intravenous  Airway Management Planned: LMA  Additional Equipment:   Intra-op Plan:   Post-operative Plan: Extubation in OR  Informed Consent: I have reviewed the patients History and Physical, chart, labs and discussed the procedure including the risks, benefits and alternatives for the proposed anesthesia with the patient or authorized representative who has indicated his/her understanding and acceptance.   Dental advisory given  Plan Discussed with: CRNA and Surgeon  Anesthesia Plan Comments:         Anesthesia Quick Evaluation  

## 2012-04-18 NOTE — Transfer of Care (Signed)
Immediate Anesthesia Transfer of Care Note  Patient: Sharon Riddle  Procedure(s) Performed: * No procedures listed *  Patient Location: PACU  Anesthesia Type: General  Level of Consciousness: awake, alert  and oriented  Airway & Oxygen Therapy: Patient Spontanous Breathing  Post-op Assessment: Report given to PACU RN  Post vital signs: Reviewed and stable  Complications: No apparent anesthesia complications

## 2012-04-18 NOTE — Anesthesia Postprocedure Evaluation (Signed)
  Anesthesia Post-op Note  Patient: Sharon Riddle  Procedure(s) Performed: Procedure(s) (LRB) with comments: ARTHROSCOPY KNEE (Right) - Removal of Loose Bodies-2  Patient Location: PACU  Anesthesia Type: General  Level of Consciousness: awake, alert  and oriented  Airway and Oxygen Therapy: Patient Spontanous Breathing  Post-op Pain: mild  Post-op Assessment: Post-op Vital signs reviewed, Patient's Cardiovascular Status Stable, Respiratory Function Stable, Patent Airway and No signs of Nausea or vomiting  Post-op Vital Signs: Reviewed and stable  Complications: No apparent anesthesia complications

## 2012-04-18 NOTE — Op Note (Signed)
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  ANESTHESIA:   General  PREOPERATIVE INDICATIONS:  Sharon Riddle is a  51 y.o. female with a diagnosis of loose body right knee who failed conservative measures and elected for surgical management.  She has substantial mechanical symptoms and wanted to get back to an active lifestyle, including running.  The risks benefits and alternatives were discussed with the patient preoperatively including but not limited to the risks of infection, bleeding, nerve injury, cardiopulmonary complications, the need for revision surgery, among others, and the patient was willing to proceed. We'll see discussed the risks that she may have progression of arthritis, incomplete relief of knee pain, recurrent loose bodies, the need for conversion to arthroplasty, among others.  OPERATIVE IMPLANTS: None  OPERATIVE FINDINGS: There were 2 large loose bodies, one measuring 2.2 cm x 1.5 cm, the other measuring 1 cm x 1 cm. There was extensive grade 4 chondral loss of the patellofemoral joint with bone-on-bone articulation. The medial and lateral compartments had minimal degenerative changes. There was a small amount of fraying of the lateral meniscus which was debrided. There is also osteophyte formation closing down the notch, but the anterior cruciate ligament and PCL were intact.  OPERATIVE PROCEDURE: The patient is brought to the operating room and placed in the supine position. General anesthesia was administered. IV antibiotics were given.  The right lower extremity was prepped and draped in usual sterile fashion. Examination under anesthesia demonstrated intact Lachman maneuver, and normal stability to varus and valgus stress.  Diagnostic arthroscopy demonstrated extensive grade 4 chondral loss of the patellofemoral joint. There is also abnormality of the distal pole of the patella, although this was not a loose body.  The lateral compartment had some grade 2 tibial changes, and a small area of grade 3 changes. The lateral meniscus was slightly frayed, and was debrided. The medial meniscus was intact as was the medial femoral condyle and tibial condyle.  After completion of the diagnostic arthroscopy, I had to use a bur in order to get to the posterior compartment, burring off the posterior medial aspect of the femoral condyle bone spur, which then gave me access to the posterior medial knee. Once in the back I used a combination of the 30 scope as well as the 70 scope to identify a total of 2 loose bodies. I placed a posterior medial portal using blunt dissection and then placed a metal cannula, and an arthroscopic grasping instrument, to remove both pieces. I inspected the lateral posterior compartment as well, and did not see any signs of loose bodies. Once I completed the loose body removal, remove the arthroscopic instruments, and drained the knee and repaired the portals with Monocryl followed by Steri-Strips and sterile gauze. She was awakened and returned to the PACU in stable and satisfactory condition. There were no complications.

## 2012-04-18 NOTE — Anesthesia Procedure Notes (Signed)
Procedure Name: LMA Insertion Date/Time: 04/18/2012 8:56 AM Performed by: Maris Berger T Pre-anesthesia Checklist: Patient identified, Emergency Drugs available, Suction available and Patient being monitored Patient Re-evaluated:Patient Re-evaluated prior to inductionOxygen Delivery Method: Circle System Utilized Preoxygenation: Pre-oxygenation with 100% oxygen Intubation Type: IV induction Ventilation: Mask ventilation without difficulty LMA: LMA inserted LMA Size: 4.0 Number of attempts: 1 Placement Confirmation: positive ETCO2 Dental Injury: Teeth and Oropharynx as per pre-operative assessment  Comments: Gauze roll between teeth

## 2012-04-18 NOTE — H&P (Signed)
PREOPERATIVE H&P  Chief Complaint: loose body right knee  HPI: Sharon Riddle is a 51 y.o. female who presents for preoperative history and physical with a diagnosis of loose body right knee. Symptoms are rated as moderate to severe, and have been worsening.  This is significantly impairing activities of daily living.  She has elected for surgical management. She has known degenerative disease in her knee, and had a long history of knee problems. Her symptoms are primarily mechanical. Relief from the locking sensations within her knee.  Past Medical History  Diagnosis Date  . Arthritis   . Hypertension     NSAID related   Past Surgical History  Procedure Date  . Ovarian cyst surgery 03-2004    right  . Tonsillectomy    History   Social History  . Marital Status: Married    Spouse Name: N/A    Number of Children: 2  . 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 Smoking status: Never Smoker   . Smokeless tobacco: None  . Alcohol Use: Yes     social  . Drug Use: No  . Sexually Active: None   Other Topics Concern  . None   Social History Narrative  . None   Family History  Problem Relation Age of Onset  . Diabetes Mother   . Goiter Mother   . Colon polyps Father   . Addison's disease Sister    Allergies  Allergen Reactions  . Nsaids     GI upset, elevates blood pressure  . Pennsaid (Diclofenac Sodium)     Elevates blood pressure  . Sulfonamide Derivatives     REACTION: rash   Prior to Admission medications   Medication Sig Start Date End Date Taking? Authorizing Provider  aspirin 81 MG tablet Take 81 mg by mouth daily.   Yes Historical Provider, MD  calcium carbonate (TUMS - DOSED IN MG ELEMENTAL CALCIUM) 500 MG chewable tablet Chew 1 tablet by mouth 2 (two) times daily.     Yes Historical Provider, MD  Cholecalciferol (VITAMIN D3) 10000 UNITS capsule Take 10,000 Units by mouth once a week.      Yes Historical Provider, MD  Chondroitin Sulfate A 150 MG CAPS Take 1 capsule by mouth daily.     Yes Historical Provider, MD  Glucosamine HCl-Glucosamin SO4 (GLUCOSAMINE COMPLEX) 200-300 MG TABS Take 1 tablet by mouth daily.     Yes Historical Provider, MD  hydrochlorothiazide (HYDRODIURIL) 25 MG tablet Take 1 tablet (25 mg total) by mouth daily. 01/01/12 12/31/12 Yes Amy Michelle Nasuti, MD     Positive ROS: All other systems have been reviewed and were otherwise negative with the exception of those mentioned in the HPI and as above.  Physical Exam: General: Alert, no acute distress Cardiovascular: No pedal edema Respiratory: No cyanosis, no use of accessory musculature GI: No organomegaly, abdomen is soft and non-tender Skin: No lesions in the area of chief complaint Neurologic: Sensation intact distally Psychiatric: Patient is competent for consent with normal mood and affect Lymphatic: No axillary or cervical lymphadenopathy  MUSCULOSKELETAL: Right knee has range of motion from 10 to 90, and is stable to varus and valgus stress.  Assessment: loose body right knee, early degenerative arthritis  Plan: Plan for Procedure(s): ARTHROSCOPY KNEE, loose body removal  The risks benefits and alternatives were discussed with the patient including but not limited to the risks of nonoperative treatment, versus  surgical intervention including infection, bleeding, nerve injury,  blood clots, cardiopulmonary complications, morbidity, mortality, among others, and they were willing to proceed. We've also discussed limitations of arthroscopy in the face of arthritis, however her symptoms are primarily mechanical. We will therefore plan to proceed with loose body removal and debridement.  Elinora Weigand P, MD Cell [phone] Pager [phone]  04/18/2012 8:34 AM

## 2012-04-21 ENCOUNTER — Encounter (HOSPITAL_BASED_OUTPATIENT_CLINIC_OR_DEPARTMENT_OTHER): Payer: Self-pay | Admitting: Orthopedic Surgery

## 2012-04-21 ENCOUNTER — Encounter: Payer: Self-pay | Admitting: *Deleted

## 2012-04-24 ENCOUNTER — Other Ambulatory Visit: Payer: Self-pay | Admitting: Family Medicine

## 2012-08-01 ENCOUNTER — Encounter: Payer: Self-pay | Admitting: Family Medicine

## 2012-08-01 ENCOUNTER — Ambulatory Visit (INDEPENDENT_AMBULATORY_CARE_PROVIDER_SITE_OTHER): Payer: BC Managed Care – PPO | Admitting: Family Medicine

## 2012-08-01 VITALS — BP 120/90 | HR 90 | Temp 98.4°F | Ht 65.0 in | Wt 132.8 lb

## 2012-08-01 DIAGNOSIS — Z0000 Encounter for general adult medical examination without abnormal findings: Secondary | ICD-10-CM

## 2012-08-01 DIAGNOSIS — R030 Elevated blood-pressure reading, without diagnosis of hypertension: Secondary | ICD-10-CM

## 2012-08-01 MED ORDER — LOSARTAN POTASSIUM-HCTZ 50-12.5 MG PO TABS: 1.0000 | ORAL_TABLET | Freq: Every day | ORAL | Status: DC

## 2012-08-01 NOTE — Progress Notes (Signed)
Subjective:    Patient ID: Sharon Riddle, female    DOB: 1960/10/07, 52 y.o.   MRN: [id number]  HPI The patient is here for annual wellness exam and preventative care.    She is doing well overall.  Hx of NSAID associated BP elevations: Today BP elevated... Diastolic at 90.  She is taking HCTZ daily. At home 110-120/85-90. Knee arthroscopy 10/4 B hip flexor tendonitis,recent recurrence of this. She has been taking ibuprofen... When she does BP goes up to 100 diastolic. She needs to be on NSAID to get hip issue to resolve so needs more BP control.  Interested in losartan HCTZ. No CP with exertion, minimal edema, no SOB.  Lifting weights, running when no hip, knee issue.  Heathy eating habits.   Wt Readings from Last 3 Encounters:  08/01/12 132 lb 12 oz (60.215 kg)  04/16/12 132 lb (59.875 kg)  04/16/12 132 lb (59.875 kg)      Due for lab eval.   Review of Systems  Constitutional: Negative for fever, fatigue and unexpected weight change.  HENT: Negative for ear pain, congestion, sore throat, sneezing, trouble swallowing and sinus pressure.   Eyes: Negative for pain and itching.  Respiratory: Negative for cough, shortness of breath and wheezing.   Cardiovascular: Negative for chest pain, palpitations and leg swelling.  Gastrointestinal: Negative for nausea, abdominal pain, diarrhea, constipation and blood in stool.  Genitourinary: Negative for dysuria, hematuria, vaginal discharge, difficulty urinating and menstrual problem.  Musculoskeletal: Positive for arthralgias.  Skin: Negative for rash.  Neurological: Negative for syncope, weakness, light-headedness, numbness and headaches.  Psychiatric/Behavioral: Negative for confusion and dysphoric mood. The patient is not nervous/anxious.        Objective:   Physical Exam  Constitutional: Vital signs are normal. She appears well-developed and well-nourished. She is cooperative.  Non-toxic appearance. She does not appear ill.  No distress.  HENT:  Head: Normocephalic.  Right Ear: Hearing, tympanic membrane, external ear and ear canal normal.  Left Ear: Hearing, tympanic membrane, external ear and ear canal normal.  Nose: Nose normal.  Eyes: Conjunctivae normal, EOM and lids are normal. Pupils are equal, round, and reactive to light. No foreign bodies found.  Neck: Trachea normal and normal range of motion. Neck supple. Carotid bruit is not present. No mass and no thyromegaly present.  Cardiovascular: Normal rate, regular rhythm, S1 normal, S2 normal, normal heart sounds and intact distal pulses.  Exam reveals no gallop.   No murmur heard. Pulmonary/Chest: Effort normal and breath sounds normal. No respiratory distress. She has no wheezes. She has no rhonchi. She has no rales.  Abdominal: Soft. Normal appearance and bowel sounds are normal. She exhibits no distension, no fluid wave, no abdominal bruit and no mass. There is no hepatosplenomegaly. There is no tenderness. There is no rebound, no guarding and no CVA tenderness. No hernia.  Lymphadenopathy:    She has no cervical adenopathy.    She has no axillary adenopathy.  Neurological: She is alert. She has normal strength. No cranial nerve deficit or sensory deficit.  Skin: Skin is warm, dry and intact. No rash noted.  Psychiatric: Her speech is normal and behavior is normal. Judgment normal. Her mood appears not anxious. Cognition and memory are normal. She does not exhibit a depressed mood.          Assessment & Plan:  The patient's preventative maintenance and recommended screening tests for an annual wellness exam were reviewed in full today. Brought up to date  unless services declined.  Counselled on the importance of diet, exercise, and its role in overall health and mortality. The patient's FH and SH was reviewed, including their home life, tobacco status, and drug and alcohol status.   Vaccine: uptodate with Tdap ad Flu Colon: Dr. Juanda Chance,  colonoscopy age 34 given cancer risk in family... Repeat 10 years.  Mammo: Schedule  PAP/DVE: See GYN next week.  Nonsmoker  DEXA:  Borderline nml, 2012.Sharon Riddle take vit D high dose in past.. 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

## 2012-08-01 NOTE — Patient Instructions (Addendum)
Return for fasting labs in 7-10 days after starting BP med.  Start losartan HCTZ. Follow BP at home. If running greater than 140/90 call for increase of losartan HCTZ to 100/25 mg. Continue weight bearing exercise and healthy eating.

## 2012-08-01 NOTE — Assessment & Plan Note (Signed)
She needs to take NSAIDs for hip flexor tendonitis... Need to initiate better BP control. Start losartan HCTZ. Check creatinine in 7-10 days.

## 2012-08-19 ENCOUNTER — Other Ambulatory Visit (INDEPENDENT_AMBULATORY_CARE_PROVIDER_SITE_OTHER): Payer: BC Managed Care – PPO

## 2012-08-19 DIAGNOSIS — R030 Elevated blood-pressure reading, without diagnosis of hypertension: Secondary | ICD-10-CM

## 2012-08-20 LAB — COMPREHENSIVE METABOLIC PANEL
BUN: 10 mg/dL (ref 6–23)
CO2: 28 mEq/L (ref 19–32)
Calcium: 9 mg/dL (ref 8.4–10.5)
Chloride: 99 mEq/L (ref 96–112)
Creatinine, Ser: 0.6 mg/dL (ref 0.4–1.2)
GFR: 107.63 mL/min (ref >60.00)

## 2012-08-20 LAB — LIPID PANEL
Cholesterol: 183 mg/dL (ref 0–200)
HDL: 59 mg/dL (ref >39.00)
Triglycerides: 50 mg/dL (ref 0.0–149.0)

## 2012-09-26 ENCOUNTER — Telehealth: Payer: Self-pay | Admitting: *Deleted

## 2012-09-26 MED ORDER — BENZONATATE 200 MG PO CAPS: 200.0000 mg | ORAL_CAPSULE | Freq: Three times a day (TID) | ORAL | Status: DC | PRN

## 2012-09-26 NOTE — Telephone Encounter (Signed)
Sent in rx.

## 2012-09-26 NOTE — Telephone Encounter (Signed)
Patient husband came by and patient has bad cold and she has been taken the over the counter cough medication and it is not helping her. Patient also can not take the narcotic medication b/c they make her sick. He is requesting tessalon perles sent to [facility].

## 2013-03-25 ENCOUNTER — Ambulatory Visit (INDEPENDENT_AMBULATORY_CARE_PROVIDER_SITE_OTHER): Payer: BC Managed Care – PPO

## 2013-03-25 DIAGNOSIS — Z23 Encounter for immunization: Secondary | ICD-10-CM

## 2013-07-11 ENCOUNTER — Other Ambulatory Visit: Payer: Self-pay | Admitting: Family Medicine

## 2013-07-21 ENCOUNTER — Ambulatory Visit (INDEPENDENT_AMBULATORY_CARE_PROVIDER_SITE_OTHER): Payer: BC Managed Care – PPO | Admitting: Sports Medicine

## 2013-07-21 ENCOUNTER — Encounter: Payer: Self-pay | Admitting: Sports Medicine

## 2013-07-21 ENCOUNTER — Ambulatory Visit
Admission: RE | Admit: 2013-07-21 | Discharge: 2013-07-21 | Disposition: A | Discharge status: Home / Self Care | Payer: BC Managed Care – PPO | Source: Ambulatory Visit | Attending: Sports Medicine | Admitting: Sports Medicine

## 2013-07-21 VITALS — BP 134/86 | Ht 65.0 in | Wt 129.0 lb

## 2013-07-21 DIAGNOSIS — M545 Low back pain, unspecified: Secondary | ICD-10-CM

## 2013-07-21 DIAGNOSIS — Q760 Spina bifida occulta: Secondary | ICD-10-CM

## 2013-07-21 DIAGNOSIS — M5137 Other intervertebral disc degeneration, lumbosacral region: Secondary | ICD-10-CM

## 2013-07-21 DIAGNOSIS — M5136 Other intervertebral disc degeneration, lumbar region: Secondary | ICD-10-CM

## 2013-07-23 DIAGNOSIS — Q760 Spina bifida occulta: Secondary | ICD-10-CM | POA: Insufficient documentation

## 2013-07-23 HISTORY — DX: Spina bifida occulta: Q76.0

## 2013-07-23 NOTE — Assessment & Plan Note (Signed)
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

## 2013-07-23 NOTE — Assessment & Plan Note (Signed)
I believe her hip Sxs are triggered by the lumbar spine  Significant change at L4/5 but this may not be responsible for all her sxs  I think we shojld check MRI  Then review best management options

## 2013-07-23 NOTE — Patient Instructions (Signed)
PRIOR AUTHORIZATION NUMBER FOR MRI IS [id number]

## 2013-07-23 NOTE — Progress Notes (Signed)
Patient ID: Sharon Riddle, female   DOB: May 08, 1961, 53 y.o.   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

## 2013-07-25 ENCOUNTER — Ambulatory Visit (HOSPITAL_BASED_OUTPATIENT_CLINIC_OR_DEPARTMENT_OTHER)
Admission: RE | Admit: 2013-07-25 | Discharge: 2013-07-25 | Disposition: A | Discharge status: Home / Self Care | Payer: BC Managed Care – PPO | Source: Ambulatory Visit | Attending: Sports Medicine | Admitting: Sports Medicine

## 2013-07-25 DIAGNOSIS — M545 Low back pain, unspecified: Secondary | ICD-10-CM

## 2013-07-25 DIAGNOSIS — M5030 Other cervical disc degeneration, unspecified cervical region: Secondary | ICD-10-CM | POA: Insufficient documentation

## 2013-07-25 DIAGNOSIS — M5126 Other intervertebral disc displacement, lumbar region: Secondary | ICD-10-CM | POA: Insufficient documentation

## 2013-07-27 ENCOUNTER — Other Ambulatory Visit: Payer: Self-pay | Admitting: *Deleted

## 2013-07-27 MED ORDER — GABAPENTIN 300 MG PO CAPS: ORAL_CAPSULE | ORAL | Status: DC

## 2013-07-29 ENCOUNTER — Ambulatory Visit: Payer: BC Managed Care – PPO | Admitting: Sports Medicine

## 2013-08-12 ENCOUNTER — Encounter: Payer: Self-pay | Admitting: Family Medicine

## 2013-08-18 ENCOUNTER — Encounter: Payer: Self-pay | Admitting: Family Medicine

## 2013-09-28 ENCOUNTER — Other Ambulatory Visit: Payer: Self-pay | Admitting: Sports Medicine

## 2013-09-29 ENCOUNTER — Ambulatory Visit: Payer: BC Managed Care – PPO | Admitting: Sports Medicine

## 2013-10-19 ENCOUNTER — Other Ambulatory Visit: Payer: Self-pay | Admitting: Family Medicine

## 2013-10-22 ENCOUNTER — Ambulatory Visit: Payer: BC Managed Care – PPO | Admitting: Sports Medicine

## 2013-10-27 ENCOUNTER — Encounter: Payer: Self-pay | Admitting: Sports Medicine

## 2013-10-27 ENCOUNTER — Ambulatory Visit (INDEPENDENT_AMBULATORY_CARE_PROVIDER_SITE_OTHER): Payer: BC Managed Care – PPO | Admitting: Sports Medicine

## 2013-10-27 VITALS — BP 132/90 | HR 70 | Ht 65.0 in | Wt 129.0 lb

## 2013-10-27 DIAGNOSIS — M5136 Other intervertebral disc degeneration, lumbar region: Secondary | ICD-10-CM

## 2013-10-27 DIAGNOSIS — M5137 Other intervertebral disc degeneration, lumbosacral region: Secondary | ICD-10-CM

## 2013-10-27 MED ORDER — GABAPENTIN 300 MG PO CAPS: 600.0000 mg | ORAL_CAPSULE | Freq: Three times a day (TID) | ORAL | Status: DC | PRN

## 2013-10-27 NOTE — Progress Notes (Signed)
   Subjective:    Patient ID: Sharon Riddle, female    DOB: 1960-12-12, 53 y.o.   MRN: [id number]  HPI  Pt presents to clinic for f/u of lower back pain which has significantly improved. She no longer has left hip pain, but does have lower back pain that shoots down both legs intermittently. Taking gabapentin 600 mg daily, this has been very helpful.  She feels like taking 600 mg twice daily would be even more helpful for symptoms. Doing McKenzie method back exercises, using inversion table, using an ergonomically correct- back bag for purse.   With this regimen she has been able to return to the gym and does most of the exercises that she has always done She is able to walk for exercise but is not able to run Exercises does not seem to worsen her symptoms  No side effects with gabapentin and it does not make her very drowsy   Review of Systems     Objective:   Physical Exam NAD/ fit lady BP 132/90  Pulse 70  Ht 5\' 5"  (1.651 m)  Wt 129 lb (58.514 kg)  BMI 21.47 kg/m2  Straight leg raise negative bilaterally Good SI joint motion bilat FABER tight bilaterally, but improved from last visit 40 ER and 35 IR  Right hip 40 ER, 25 ER left hip  Leg length inequality has corrected 90%/ still shifts from lying to sitting   Walking gait has substantially improved but she still has less push off and less normal pelvic rotation on the left side       Assessment & Plan:

## 2013-10-27 NOTE — Patient Instructions (Signed)
Ok to work abdominal excises   Continue standing hip rotations   Increase gabapentin 600 mg 2-3 times per day  Please follow up in 6 months or sooner if needed  Thank you for seeing [place] today!

## 2013-10-27 NOTE — Assessment & Plan Note (Signed)
Dramatic improvement in her symptoms with gabapentin We will increase the dose to see if we get better control during the day  Continue back exercise regimen  Continue regular aerobic exercise/ weight on her walking gait  Recheck in 6 months

## 2013-11-25 ENCOUNTER — Ambulatory Visit (INDEPENDENT_AMBULATORY_CARE_PROVIDER_SITE_OTHER): Payer: BC Managed Care – PPO | Admitting: Family Medicine

## 2013-11-25 ENCOUNTER — Encounter: Payer: Self-pay | Admitting: Family Medicine

## 2013-11-25 VITALS — BP 120/82 | HR 77 | Temp 99.2°F | Ht 64.17 in | Wt 133.0 lb

## 2013-11-25 DIAGNOSIS — Z13220 Encounter for screening for lipoid disorders: Secondary | ICD-10-CM

## 2013-11-25 DIAGNOSIS — Z0000 Encounter for general adult medical examination without abnormal findings: Secondary | ICD-10-CM

## 2013-11-25 DIAGNOSIS — Z23 Encounter for immunization: Secondary | ICD-10-CM

## 2013-11-25 DIAGNOSIS — R030 Elevated blood-pressure reading, without diagnosis of hypertension: Secondary | ICD-10-CM

## 2013-11-25 DIAGNOSIS — Z111 Encounter for screening for respiratory tuberculosis: Secondary | ICD-10-CM

## 2013-11-25 LAB — LIPID PANEL
CHOLESTEROL: 181 mg/dL (ref 0–200)
HDL: 55.1 mg/dL (ref >39.00)
LDL Cholesterol: 109 mg/dL — ABNORMAL HIGH (ref 0–99)
Total CHOL/HDL Ratio: 3
Triglycerides: 84 mg/dL (ref 0.0–149.0)
VLDL: 16.8 mg/dL (ref 0.0–40.0)

## 2013-11-25 LAB — COMPREHENSIVE METABOLIC PANEL
ALBUMIN: 4.4 g/dL (ref 3.5–5.2)
ALK PHOS: 59 U/L (ref 39–117)
ALT: 18 U/L (ref 0–35)
AST: 21 U/L (ref 0–37)
BUN: 10 mg/dL (ref 6–23)
CALCIUM: 9.4 mg/dL (ref 8.4–10.5)
CHLORIDE: 96 meq/L (ref 96–112)
CO2: 30 meq/L (ref 19–32)
Creatinine, Ser: 0.6 mg/dL (ref 0.4–1.2)
GFR: 109.13 mL/min (ref >60.00)
GLUCOSE: 81 mg/dL (ref 70–99)
POTASSIUM: 4.3 meq/L (ref 3.5–5.1)
SODIUM: 133 meq/L — AB (ref 135–145)
TOTAL PROTEIN: 7.2 g/dL (ref 6.0–8.3)
Total Bilirubin: 0.5 mg/dL (ref 0.2–1.2)

## 2013-11-25 MED ORDER — MECLIZINE HCL 25 MG PO TABS: 25.0000 mg | ORAL_TABLET | Freq: Three times a day (TID) | ORAL | Status: DC | PRN

## 2013-11-25 NOTE — Patient Instructions (Addendum)
Call to schedule colonoscopy as scheduled.  Stop at lab on way out.

## 2013-11-25 NOTE — Progress Notes (Signed)
The patient is here for annual wellness exam and preventative care.   She is doing well overall.   Hx of NSAID associated BP elevations: BP Readings from Last 3 Encounters:  11/25/13 120/82  10/27/13 132/90  07/21/13 134/86  She is taking  losartan/HCTZ daily.  At home  108-120/80  Heathy eating habits.  Due for lab eval.   Left hip pain: has herniated disc L1-S1, hx of spina bifida occulta.  Followed by Andreas Blower. Stretching. Started on gabapentin.    Wt Readings from Last 3 Encounters:  11/25/13 133 lb (60.328 kg)  10/27/13 129 lb (58.514 kg)  07/21/13 129 lb (58.514 kg)     Review of Systems  Constitutional: Negative for fever, fatigue and unexpected weight change.  HENT: Negative for ear pain, congestion, sore throat, sneezing, trouble swallowing and sinus pressure.  Eyes: Negative for pain and itching.  Respiratory: Negative for cough, shortness of breath and wheezing.  Cardiovascular: Negative for chest pain, palpitations and leg swelling.  Gastrointestinal: Negative for nausea, abdominal pain, diarrhea, constipation and blood in stool.  Genitourinary: Negative for dysuria, hematuria, vaginal discharge, difficulty urinating and menstrual problem.  Musculoskeletal: Positive for arthralgias.  Skin: Negative for rash.  Neurological: Negative for syncope, weakness, light-headedness, numbness and headaches.  Psychiatric/Behavioral: Negative for confusion and dysphoric mood. The patient is not nervous/anxious.  Objective:   Physical Exam  Constitutional: Vital signs are normal. She appears well-developed and well-nourished. She is cooperative. Non-toxic appearance. She does not appear ill. No distress.  HENT:  Head: Normocephalic.  Right Ear: Hearing, tympanic membrane, external ear and ear canal normal.  Left Ear: Hearing, tympanic membrane, external ear and ear canal normal.  Nose: Nose normal.  Eyes: Conjunctivae normal, EOM and lids are normal. Pupils are equal,  round, and reactive to light. No foreign bodies found.  Neck: Trachea normal and normal range of motion. Neck supple. Carotid bruit is not present. No mass and no thyromegaly present.  Cardiovascular: Normal rate, regular rhythm, S1 normal, S2 normal, normal heart sounds and intact distal pulses. Exam reveals no gallop.  No murmur heard.  Pulmonary/Chest: Effort normal and breath sounds normal. No respiratory distress. She has no wheezes. She has no rhonchi. She has no rales.  Abdominal: Soft. Normal appearance and bowel sounds are normal. She exhibits no distension, no fluid wave, no abdominal bruit and no mass. There is no hepatosplenomegaly. There is no tenderness. There is no rebound, no guarding and no CVA tenderness. No hernia.  Lymphadenopathy:  She has no cervical adenopathy.  She has no axillary adenopathy.  Neurological: She is alert. She has normal strength. No cranial nerve deficit or sensory deficit.  Skin: Skin is warm, dry and intact. No rash noted.  Psychiatric: Her speech is normal and behavior is normal. Judgment normal. Her mood appears not anxious. Cognition and memory are normal. She does not exhibit a depressed mood.  Assessment & Plan:   The patient's preventative maintenance and recommended screening tests for an annual wellness exam were reviewed in full today.  Brought up to date unless services declined.  Counselled on the importance of diet, exercise, and its role in overall health and mortality.  The patient's FH and SH was reviewed, including their home life, tobacco status, and drug and alcohol status.   Vaccine: uptodate with Tdap and Flu  Colon: Dr. Olevia Perches, colonoscopy age 71 given cancer risk in family... Repeat 10 years.  Mammo: 07/2013 PAP/DVE: See GYN next week.  Nonsmoker  DEXA: Borderline  nml, 2012.Reubin Milan take vit D high dose in past.. Serum sickness. So takes 10, 000 units everyday.

## 2013-11-25 NOTE — Progress Notes (Signed)
Pre visit review using our clinic review tool, if applicable. No additional management support is needed unless otherwise documented below in the visit note. 

## 2013-11-30 ENCOUNTER — Encounter: Payer: Self-pay | Admitting: *Deleted

## 2013-11-30 LAB — TB SKIN TEST
INDURATION: 0 mm
TB SKIN TEST: NEGATIVE

## 2013-12-16 ENCOUNTER — Other Ambulatory Visit: Payer: Self-pay | Admitting: Sports Medicine

## 2014-01-17 ENCOUNTER — Other Ambulatory Visit: Payer: Self-pay | Admitting: Family Medicine

## 2014-01-18 ENCOUNTER — Other Ambulatory Visit: Payer: Self-pay | Admitting: Family Medicine

## 2014-02-02 ENCOUNTER — Encounter: Payer: Self-pay | Admitting: Internal Medicine

## 2014-04-05 ENCOUNTER — Other Ambulatory Visit: Payer: Self-pay | Admitting: Family Medicine

## 2014-04-05 DIAGNOSIS — M5416 Radiculopathy, lumbar region: Secondary | ICD-10-CM

## 2014-04-05 DIAGNOSIS — R202 Paresthesia of skin: Secondary | ICD-10-CM

## 2014-04-05 DIAGNOSIS — R200 Anesthesia of skin: Secondary | ICD-10-CM

## 2014-04-05 DIAGNOSIS — R531 Weakness: Secondary | ICD-10-CM

## 2014-04-05 DIAGNOSIS — R292 Abnormal reflex: Secondary | ICD-10-CM

## 2014-04-05 NOTE — Progress Notes (Signed)
D/w Dr. Silvio Pate who asked me to consult [facility] in this case. Patient is known well by me, but I have only peripherally been involved in this case.

## 2014-04-19 ENCOUNTER — Ambulatory Visit (INDEPENDENT_AMBULATORY_CARE_PROVIDER_SITE_OTHER): Payer: BC Managed Care – PPO | Admitting: *Deleted

## 2014-04-19 DIAGNOSIS — Z23 Encounter for immunization: Secondary | ICD-10-CM

## 2014-04-23 ENCOUNTER — Ambulatory Visit (INDEPENDENT_AMBULATORY_CARE_PROVIDER_SITE_OTHER): Payer: BC Managed Care – PPO | Admitting: Neurology

## 2014-04-23 ENCOUNTER — Encounter: Payer: Self-pay | Admitting: Neurology

## 2014-04-23 VITALS — BP 110/78 | HR 73 | Ht 64.0 in | Wt 133.1 lb

## 2014-04-23 DIAGNOSIS — M79606 Pain in leg, unspecified: Secondary | ICD-10-CM

## 2014-04-23 LAB — SEDIMENTATION RATE: SED RATE: 1 mm/h (ref 0–22)

## 2014-04-23 LAB — C-REACTIVE PROTEIN: CRP: <0.5 mg/dL (ref <0.60)

## 2014-04-23 LAB — RHEUMATOID FACTOR: Rhuematoid fact SerPl-aCnc: 18 IU/mL — ABNORMAL HIGH (ref <=14)

## 2014-04-23 MED ORDER — PREDNISONE 10 MG PO TABS: ORAL_TABLET | ORAL | Status: DC

## 2014-04-23 NOTE — Progress Notes (Signed)
[facility] Clinic Note - Initial Visit   Date: 04/23/2014  Sharon Riddle MRN: [id number] DOB: Jul 05, 1961   Dear Dr. Diona Browner:  Thank you for your kind referral of Sharon Riddle for consultation of bilateral thigh pain. Although her history is well known to you, please allow [place] to reiterate it for the purpose of our medical record. The patient was accompanied to the clinic by self.    History of Present Illness: Sharon Riddle is a 53 y.o. right-handed Caucasian female with history of hypertension presenting for evaluation of bilateral thigh pain.    She has always been very active and does long distance running for most of her life.  Starting in 2013, she developed shooting pain over the left lateral thigh which was initially treated as patellofemoral syndrome. Plain films of the hips did not show any abnormalities.  She tried doing hip exercises, but nothing helped.  Soon, she developed the same symptoms over the right leg.    She saw Dr. Hector Shade (Sports Medicine) who said that she did not have a hip problem and ordered MRI lumbar spine.  Imagine of the lumbar spine showed disc degeneration with central disc herniation without neural compression at L4-5.  She was given a trial of gabapentin 335m TID helped her sleep, but did not change her pain.  She had no numbness/tingling, back pain, or bowel/bladder problems.  She took a month off physical exercise and feels that it made her symptoms worse.  She had reduced her physical activity tremendously.  She still tries walking daily, but the rate of her exercise has reduced. She fell once - with prolonged walking, she feels that the leg can give out. She is most bothered by a limp, which is more pain limiting, than actual weakness.  She was evaluated by Dr. [place]Arloa Kohwith [place] for her lumbar disc herniation and he did not feel that symptoms were due to imaiging findings, and referred  her for neurological evaluation.   Past Medical History  Diagnosis Date  . Arthritis   . Loose body of right knee 04/18/2012  . Hypertension     NSAID related    Past Surgical History  Procedure Laterality Date  . Tonsillectomy    . Knee arthroscopy  04/18/2012    Procedure: ARTHROSCOPY KNEE;  Surgeon: JJohnny Bridge MD;  Location[place] [facility]  Service: Orthopedics;  Laterality: Right;  Removal of Loose Bodies-2     Medications:  Current Outpatient Prescriptions on File Prior to Visit  Medication Sig Dispense Refill  . aspirin 81 MG tablet Take 81 mg by mouth daily.      . calcium carbonate (TUMS - DOSED IN MG ELEMENTAL CALCIUM) 500 MG chewable tablet Chew 1 tablet by mouth 2 (two) times daily.        . Cholecalciferol (VITAMIN D3) 3000 UNITS TABS Take 1 tablet by mouth daily.      . Chondroitin Sulfate A 150 MG CAPS Take 1 capsule by mouth daily.        . Glucosamine HCl-Glucosamin SO4 (GLUCOSAMINE COMPLEX) 200-300 MG TABS Take 1 tablet by mouth daily.        .Marland Kitchenlosartan-hydrochlorothiazide (HYZAAR) 50-12.5 MG per tablet TAKE 1 TABLET BY MOUTH DAILY.  90 tablet  3  . meclizine (ANTIVERT) 25 MG tablet Take 1 tablet (25 mg total) by mouth 3 (three) times daily as needed for dizziness.  30 tablet  0   No  current facility-administered medications on file prior to visit.    Allergies:  Allergies  Allergen Reactions  . Nsaids     GI upset, elevates blood pressure  . Pennsaid [Diclofenac Sodium]     Elevates blood pressure  . Sulfonamide Derivatives     REACTION: rash    Family History: Family History  Problem Relation Age of Onset  . Diabetes Mother   . Goiter Mother   . Colon polyps Father   . Addison's disease Sister   . Lymphoma Mother     Deceased, 55  . Bladder Cancer Father     Deceased 37  . Crohn's disease Son   . Healthy Daughter   . Healthy Brother     Social History: History   Social History  . Marital Status: Married    Spouse  Name: N/A    Number of Children: 2  . 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 Smoking status: Never Smoker   . Smokeless tobacco: Never Used  . Alcohol Use: Yes     Comment: social, red wine 1 glass 3-4 drinks per week  . Drug Use: No  . Sexual Activity: Not on file   Other Topics Concern  . Not on file   Social History Narrative   She lives with husband.   Bedroom is on the first floor.     Education: Phd in nursing.  Has 2 grown children.    Review of Systems:  CONSTITUTIONAL: No fevers, chills, night sweats, or weight loss. EYES: No visual changes or eye pain ENT: No hearing changes.  No history of nose bleeds.   RESPIRATORY: No cough, wheezing and shortness of breath.   CARDIOVASCULAR: Negative for chest pain, and palpitations.   GI: Negative for abdominal discomfort, blood in stools or black stools.  No recent change in bowel habits.  GU:  No history of incontinence.   MUSCLOSKELETAL: +history of joint pain or swelling.  No myalgias.   SKIN: Negative for lesions, rash, and itching.   HEMATOLOGY/ONCOLOGY: Negative for prolonged bleeding, bruising easily, and swollen nodes.  No history of cancer.   ENDOCRINE: Negative for cold or heat intolerance, polydipsia or goiter.   PSYCH:  No depression or anxiety symptoms.   NEURO: As Above.   Vital Signs:  BP 110/78  Pulse 73  Ht 5' 4"  (1.626 m)  Wt 133 lb 1 oz (60.357 kg)  BMI 22.83 kg/m2  SpO2 98%  General Medical Exam: General:  Well appearing, comfortable.   Eyes/ENT: see cranial nerve examination.   Neck: No masses appreciated.  Full range of motion without tenderness.  No carotid bruits. Respiratory:  Clear to auscultation, good air entry bilaterally.   Cardiac:  Regular rate and rhythm, no murmur.   Extremities:  No deformities, edema, or skin discoloration. Good capillary refill.   Skin:  Skin color, texture, turgor normal. No rashes or  lesions.  Neurological Exam: MENTAL STATUS including orientation to time, place, person, recent and remote memory, attention span and concentration, language, and fund of knowledge is normal.  Speech is not dysarthric.  CRANIAL NERVES: II:  No visual field defects.  Unremarkable fundi.   III-IV-VI: Pupils equal round and reactive to light.  Normal conjugate, extra-ocular eye movements in all directions of gaze.  No nystagmus.  No ptosis.   V:  Normal facial sensation.   VII:  Normal facial symmetry and movements.  VIII:  Normal hearing and vestibular function.   IX-X:  Normal palatal movement.   XI:  Normal shoulder shrug and head rotation.   XII:  Normal tongue strength and range of motion, no deviation or fasciculation.  MOTOR:  Pain with bilateral hip external rotation and adduction of the legs.  Straight leg raise is negative bilaterally. No atrophy, fasciculations or abnormal movements.  No pronator drift.  Tone is normal.    Right Upper Extremity:    Left Upper Extremity:    Deltoid  5/5   Deltoid  5/5   Biceps  5/5   Biceps  5/5   Triceps  5/5   Triceps  5/5   Wrist extensors  5/5   Wrist extensors  5/5   Wrist flexors  5/5   Wrist flexors  5/5   Finger extensors  5/5   Finger extensors  5/5   Finger flexors  5/5   Finger flexors  5/5   Dorsal interossei  5/5   Dorsal interossei  5/5   Abductor pollicis  5/5   Abductor pollicis  5/5   Tone (Ashworth scale)  0  Tone (Ashworth scale)  0   Right Lower Extremity:    Left Lower Extremity:    Hip flexors  5/5   Hip flexors  5/5   Hip extensors  5/5   Hip extensors  5/5   Knee flexors  5/5   Knee flexors  5/5   Knee extensors  5/5   Knee extensors  5/5   Dorsiflexors  5/5   Dorsiflexors  5/5   Plantarflexors  5/5   Plantarflexors  5/5   Toe extensors  5/5   Toe extensors  5/5   Toe flexors  5/5   Toe flexors  5/5   Tone (Ashworth scale)  0  Tone (Ashworth scale)  0   MSRs:  Right                                                                  Left brachioradialis 2+  brachioradialis 2+  biceps 2+  biceps 2+  triceps 2+  triceps 2+  patellar 2+  patellar 2+  ankle jerk 2+  ankle jerk 2+  Hoffman no  Hoffman no  plantar response down  plantar response down   SENSORY:  Normal and symmetric perception of light touch, pinprick, vibration, and proprioception.  Romberg's sign absent.   COORDINATION/GAIT: Normal finger-to- nose-finger and heel-to-shin.  Intact rapid alternating movements bilaterally.  Able to rise from a chair without using arms.  Gait antalgic appearing favoring the right leg, but stable. Tandem and stressed gait intact.   Data: MRI lumbar spine wo contrast 07/25/2013: L5 is transitional.  L1-2: Shallow broad-based disc herniation indents the thecal sac slightly but does not appear to cause neural compression.  L3-4: Broad-based disc herniation indents the thecal sac. Mild facet and ligamentous hypertrophy. Mild stenosis of the lateral recesses without gross neural compression.  L4-5: More advanced chronic disc degeneration with a central disc herniation showing slight caudal migration in the midline. This indents the thecal sac. Definite neural compression is not established, but this could be symptomatic. Mild facet hypertrophy at this level.  L5-S1: Transitional level. Annular tearing and annular bulging without gross  neural compression.  MRI brain wwo contrast 04/28/2014:  Normal    IMPRESSION: Ms. Galeno is a 53 year-old female presenting for evaluation of bilateral lateral thigh pain.  Her neurological exam is non-focal.   Motor stregnth, sensation, and reflexes are intact. She had antaglic gait pattern with reproduction of pain with internal rotation, suggesting possible musculoskeletal or join-related dysfunction. I do not feel that her central disc herniation at L4-5 is severe enough to cause neural impingement.  EMG of the legs will be ordered to better characterize the nature of her  symptoms.  If neurological work-up is negative, she may need MRI hips going forward.  Based on her history and exam, symptoms are most suggestive of iliotibial band tendonitis, but she reports not having any benefit with traditional therapies for this.       PLAN/RECOMMENDATIONS:  1.  Check ESR, CRP, RF, ANA 2.  EMG of the legs 3.  Trial of prednisone for pain 4.  Telephone update with results 5.  Return to clinic as needed   The duration of this appointment visit was 50 minutes of face-to-face time with the patient.  Greater than 50% of this time was spent in counseling, explanation of diagnosis, planning of further management, and coordination of care.   Thank you for allowing me to participate in patient's care.  If I can answer any additional questions, I would be pleased to do so.    Sincerely,    Thomos Domine K. Posey Pronto, DO

## 2014-04-23 NOTE — Patient Instructions (Addendum)
1.  Check ESR, CRP, RF, ANA 2.  EMG of the legs 3.  Trial of prednisone for pain 4.  Return to clinic as needed

## 2014-04-26 LAB — ANA: Anti Nuclear Antibody(ANA): NEGATIVE

## 2014-04-28 ENCOUNTER — Telehealth: Payer: Self-pay | Admitting: Neurology

## 2014-04-28 NOTE — Telephone Encounter (Signed)
Called patient back and left her a message to call me for lab results.

## 2014-04-28 NOTE — Telephone Encounter (Signed)
Pt is returning a call please call [phone]

## 2014-05-03 ENCOUNTER — Telehealth: Payer: Self-pay | Admitting: Neurology

## 2014-05-03 NOTE — Telephone Encounter (Signed)
Noted.  Donika K. Patel, DO   

## 2014-05-03 NOTE — Telephone Encounter (Signed)
Pt called to cancel her 06/14/14 EMG. Pt found out she has a left fracture hip. Pt is having a total hip replacement in 05/13/14. C/B [phone]

## 2014-05-04 ENCOUNTER — Encounter (HOSPITAL_COMMUNITY): Payer: Self-pay | Admitting: Pharmacy Technician

## 2014-05-05 NOTE — Pre-Procedure Instructions (Signed)
KENLEE VOGT  05/05/2014   Your procedure is scheduled on:  Thursday, May 13, 2014  Report to [facility] Entrance "A" [address] at [employer] AM.  Call this number if you have problems the morning of surgery: [phone]   Remember:   Do not eat food or drink liquids after midnight.   Take these medicines the morning of surgery with A SIP OF WATER: meclizine (Antivert--if needed)  STOP taking Aspirin, Goody's, BC's, Aleve (Naproxen), Ibuprofen (Advil or Motrin), Fish Oil, Vitamins, Herbal Supplements or any substance that could thin your blood starting today May 06, 2014.  Continue to take all your other medicines as you normally do until day of surgery and then follow above instructions.   Do not wear jewelry, make-up or nail polish.  Do not wear lotions, powders, or perfumes. You may wear deodorant.  Do not shave 48 hours prior to surgery.   Do not bring valuables to the hospital.  [facility] is not responsible                  for any belongings or valuables.               Contacts, dentures or bridgework may not be worn into surgery.  Leave suitcase in the car. After surgery it may be brought to your room.  For patients admitted to the hospital, discharge time is determined by your                treatment team.                Special Instructions: Please use CHG soap the night before surgery and the day of surgery. CHG soap should be used atleast twice.   Please read over the following fact sheets that you were given: Pain Booklet, Coughing and Deep Breathing, Blood Transfusion Information, Total Joint Packet, MRSA Information and Surgical Site Infection Prevention

## 2014-05-06 ENCOUNTER — Encounter (HOSPITAL_COMMUNITY)
Admission: RE | Admit: 2014-05-06 | Discharge: 2014-05-06 | Disposition: A | Discharge status: Home / Self Care | Payer: BC Managed Care – PPO | Source: Ambulatory Visit | Attending: Anesthesiology | Admitting: Anesthesiology

## 2014-05-06 ENCOUNTER — Encounter (HOSPITAL_COMMUNITY)
Admission: RE | Admit: 2014-05-06 | Discharge: 2014-05-06 | Disposition: A | Discharge status: Home / Self Care | Payer: BC Managed Care – PPO | Source: Ambulatory Visit | Attending: Orthopedic Surgery | Admitting: Orthopedic Surgery

## 2014-05-06 ENCOUNTER — Encounter (HOSPITAL_COMMUNITY): Payer: Self-pay

## 2014-05-06 DIAGNOSIS — I10 Essential (primary) hypertension: Secondary | ICD-10-CM

## 2014-05-06 DIAGNOSIS — R42 Dizziness and giddiness: Secondary | ICD-10-CM | POA: Diagnosis not present

## 2014-05-06 DIAGNOSIS — M1612 Unilateral primary osteoarthritis, left hip: Secondary | ICD-10-CM | POA: Diagnosis not present

## 2014-05-06 DIAGNOSIS — Z01818 Encounter for other preprocedural examination: Secondary | ICD-10-CM | POA: Diagnosis present

## 2014-05-06 HISTORY — DX: Dizziness and giddiness: R42

## 2014-05-06 HISTORY — DX: Other complications of anesthesia, initial encounter: T88.59XA

## 2014-05-06 HISTORY — DX: Nausea with vomiting, unspecified: R11.2

## 2014-05-06 HISTORY — DX: Personal history of urinary calculi: Z87.442

## 2014-05-06 HISTORY — DX: Adverse effect of unspecified anesthetic, initial encounter: T41.45XA

## 2014-05-06 HISTORY — DX: Other specified postprocedural states: Z98.890

## 2014-05-06 LAB — CBC
HCT: 41.5 % (ref 36.0–46.0)
Hemoglobin: 13.9 g/dL (ref 12.0–15.0)
MCH: 30.2 pg (ref 26.0–34.0)
MCHC: 33.5 g/dL (ref 30.0–36.0)
MCV: 90.2 fL (ref 78.0–100.0)
Platelets: 266 10*3/uL (ref 150–400)
RBC: 4.6 MIL/uL (ref 3.87–5.11)
RDW: 13.7 % (ref 11.5–15.5)
WBC: 4.3 10*3/uL (ref 4.0–10.5)

## 2014-05-06 LAB — BASIC METABOLIC PANEL
ANION GAP: 10 (ref 5–15)
BUN: 12 mg/dL (ref 6–23)
CALCIUM: 9.6 mg/dL (ref 8.4–10.5)
CHLORIDE: 99 meq/L (ref 96–112)
CO2: 27 mEq/L (ref 19–32)
Creatinine, Ser: 0.58 mg/dL (ref 0.50–1.10)
GFR calc Af Amer: >90 mL/min (ref >90)
GFR calc non Af Amer: >90 mL/min (ref >90)
Glucose, Bld: 80 mg/dL (ref 70–99)
Potassium: 4.5 mEq/L (ref 3.7–5.3)
SODIUM: 136 meq/L — AB (ref 137–147)

## 2014-05-06 LAB — SURGICAL PCR SCREEN
MRSA, PCR: NEGATIVE
STAPHYLOCOCCUS AUREUS: NEGATIVE

## 2014-05-06 LAB — PROTIME-INR
INR: 1 (ref 0.00–1.49)
PROTHROMBIN TIME: 13.3 s (ref 11.6–15.2)

## 2014-05-06 LAB — TYPE AND SCREEN
ABO/RH(D): O POS
Antibody Screen: NEGATIVE

## 2014-05-06 LAB — APTT: aPTT: 30 seconds (ref 24–37)

## 2014-05-06 LAB — ABO/RH: ABO/RH(D): O POS

## 2014-05-06 NOTE — Progress Notes (Signed)
Anesthesia Chart Review:   Pt is 53 year old female scheduled for L total hip arthroplasty on 05/13/2014 with Dr. Mardelle Matte.   PMH: HTN, vertigo.   Medications include: losartan/hctz, antivert  Preoperative labs reviewed.    Chest x-ray reviewed. No active cardiopulmonary disease.  EKG: sinus bradycardia, 56 bpm. Incomplete RBBB. Poor R wave progression.   If no changes, I anticipate pt can proceed with surgery as scheduled.   Willeen Cass, FNP-BC [facility]/Anesthesiology Phone: [phone] 05/06/2014 2:47 PM

## 2014-05-06 NOTE — Pre-Procedure Instructions (Signed)
Sharon Riddle  05/06/2014   Your procedure is scheduled on:  Thursday, May 13, 2014 at 7:30 AM.  Report to [facility] Entrance "A" [address] at [employer] AM.  Call this number if you have problems the morning of surgery: [phone]   Remember:   Do not eat food or drink liquids after midnight.   Take these medicines the morning of surgery with A SIP OF WATER: Meclizine (Antivert--if needed)  STOP taking Aspirin, Goody's, BC's, Aleve (Naproxen), Ibuprofen (Advil or Motrin), Fish Oil, Vitamins, Herbal Supplements or any substance that could thin your blood starting today May 06, 2014.  Continue to take all your other medicines as you normally do until day of surgery and then follow above instructions.   Do not wear jewelry, make-up or nail polish.  Do not wear lotions, powders, or perfumes.   Do not shave 48 hours prior to surgery.   Do not bring valuables to the hospital.  [facility] is not responsible for any belongings or valuables.               Contacts, dentures or bridgework may not be worn into surgery.  Leave suitcase in the car. After surgery it may be brought to your room.  For patients admitted to the hospital, discharge time is determined by your treatment team.                Special Instructions: Please use CHG soap the night before surgery and the day of surgery. CHG soap should be used twice.   Please read over the following fact sheets that you were given: Pain Booklet, Coughing and Deep Breathing, Blood Transfusion Information, Total Joint Packet, MRSA Information and Surgical Site Infection Prevention

## 2014-05-12 MED ORDER — CEFAZOLIN SODIUM-DEXTROSE 2-3 GM-% IV SOLR
2.0000 g | INTRAVENOUS | Status: DC
  Filled 2014-05-12: qty 50

## 2014-05-12 NOTE — Anesthesia Preprocedure Evaluation (Addendum)
Anesthesia Evaluation  Patient identified by MRN, date of birth, ID band Patient awake    Reviewed: Allergy & Precautions, H&P , NPO status , Patient's Chart, lab work & pertinent test results  History of Anesthesia Complications (+) PONV  Airway Mallampati: II   Neck ROM: Full    Dental  (+) Teeth Intact   Pulmonary  breath sounds clear to auscultation        Cardiovascular hypertension, Pt. on medications Rhythm:Regular     Neuro/Psych    GI/Hepatic   Endo/Other    Renal/GU      Musculoskeletal  (+) Arthritis -,   Abdominal (+)  Abdomen: soft.    Peds  Hematology   Anesthesia Other Findings   Reproductive/Obstetrics                           Anesthesia Physical Anesthesia Plan  ASA: II  Anesthesia Plan: General   Post-op Pain Management:    Induction: Intravenous  Airway Management Planned: Oral ETT  Additional Equipment:   Intra-op Plan:   Post-operative Plan: Extubation in OR  Informed Consent: I have reviewed the patients History and Physical, chart, labs and discussed the procedure including the risks, benefits and alternatives for the proposed anesthesia with the patient or authorized representative who has indicated his/her understanding and acceptance.     Plan Discussed with:   Anesthesia Plan Comments:         Anesthesia Quick Evaluation

## 2014-05-13 ENCOUNTER — Encounter (HOSPITAL_COMMUNITY): Payer: BC Managed Care – PPO | Admitting: Emergency Medicine

## 2014-05-13 ENCOUNTER — Inpatient Hospital Stay (HOSPITAL_COMMUNITY): Payer: BC Managed Care – PPO

## 2014-05-13 ENCOUNTER — Inpatient Hospital Stay (HOSPITAL_COMMUNITY)
Admission: RE | Admit: 2014-05-13 | Discharge: 2014-05-14 | DRG: 470 | Disposition: A | Discharge status: Home Health | Payer: BC Managed Care – PPO | Source: Ambulatory Visit | Attending: Orthopedic Surgery | Admitting: Orthopedic Surgery

## 2014-05-13 ENCOUNTER — Encounter (HOSPITAL_COMMUNITY): Payer: Self-pay | Admitting: Surgery

## 2014-05-13 ENCOUNTER — Encounter (HOSPITAL_COMMUNITY)
Admission: RE | Disposition: A | Discharge status: Home Health | Payer: Self-pay | Source: Ambulatory Visit | Attending: Orthopedic Surgery

## 2014-05-13 ENCOUNTER — Inpatient Hospital Stay (HOSPITAL_COMMUNITY): Payer: BC Managed Care – PPO | Admitting: Anesthesiology

## 2014-05-13 DIAGNOSIS — Z888 Allergy status to other drugs, medicaments and biological substances status: Secondary | ICD-10-CM | POA: Diagnosis not present

## 2014-05-13 DIAGNOSIS — M1610 Unilateral primary osteoarthritis, unspecified hip: Secondary | ICD-10-CM | POA: Diagnosis present

## 2014-05-13 DIAGNOSIS — Z7901 Long term (current) use of anticoagulants: Secondary | ICD-10-CM

## 2014-05-13 DIAGNOSIS — Z882 Allergy status to sulfonamides status: Secondary | ICD-10-CM | POA: Diagnosis not present

## 2014-05-13 DIAGNOSIS — Z886 Allergy status to analgesic agent status: Secondary | ICD-10-CM | POA: Diagnosis not present

## 2014-05-13 DIAGNOSIS — M1612 Unilateral primary osteoarthritis, left hip: Secondary | ICD-10-CM | POA: Diagnosis present

## 2014-05-13 DIAGNOSIS — M25552 Pain in left hip: Secondary | ICD-10-CM | POA: Diagnosis present

## 2014-05-13 DIAGNOSIS — I10 Essential (primary) hypertension: Secondary | ICD-10-CM | POA: Diagnosis present

## 2014-05-13 DIAGNOSIS — Z833 Family history of diabetes mellitus: Secondary | ICD-10-CM

## 2014-05-13 DIAGNOSIS — Z79899 Other long term (current) drug therapy: Secondary | ICD-10-CM | POA: Diagnosis not present

## 2014-05-13 DIAGNOSIS — Z807 Family history of other malignant neoplasms of lymphoid, hematopoietic and related tissues: Secondary | ICD-10-CM

## 2014-05-13 DIAGNOSIS — M8548 Solitary bone cyst, other site: Secondary | ICD-10-CM | POA: Diagnosis present

## 2014-05-13 DIAGNOSIS — Q6589 Other specified congenital deformities of hip: Secondary | ICD-10-CM

## 2014-05-13 DIAGNOSIS — M1632 Unilateral osteoarthritis resulting from hip dysplasia, left hip: Secondary | ICD-10-CM

## 2014-05-13 HISTORY — DX: Unilateral primary osteoarthritis, left hip: M16.12

## 2014-05-13 HISTORY — PX: TOTAL HIP ARTHROPLASTY: SHX124

## 2014-05-13 SURGERY — ARTHROPLASTY, HIP, TOTAL,POSTERIOR APPROACH
Anesthesia: General | Laterality: Left

## 2014-05-13 MED ORDER — MIDAZOLAM HCL 2 MG/2ML IJ SOLN
INTRAMUSCULAR | Status: AC
  Filled 2014-05-13: qty 2

## 2014-05-13 MED ORDER — METOCLOPRAMIDE HCL 10 MG PO TABS
5.0000 mg | ORAL_TABLET | Freq: Three times a day (TID) | ORAL | Status: DC | PRN
Start: 2014-05-13 — End: 2014-05-14

## 2014-05-13 MED ORDER — RIVAROXABAN 10 MG PO TABS
10.0000 mg | ORAL_TABLET | Freq: Every day | ORAL | Status: DC
  Administered 2014-05-14: 10 mg via ORAL
  Filled 2014-05-13 (×2): qty 1

## 2014-05-13 MED ORDER — MENTHOL 3 MG MT LOZG: 1.0000 | LOZENGE | OROMUCOSAL | Status: DC | PRN

## 2014-05-13 MED ORDER — LIDOCAINE HCL (CARDIAC) 20 MG/ML IV SOLN
INTRAVENOUS | Status: DC | PRN
  Administered 2014-05-13: 100 mg via INTRAVENOUS

## 2014-05-13 MED ORDER — MECLIZINE HCL 25 MG PO TABS
25.0000 mg | ORAL_TABLET | Freq: Three times a day (TID) | ORAL | Status: DC | PRN
  Filled 2014-05-13: qty 1

## 2014-05-13 MED ORDER — HYDROMORPHONE HCL 1 MG/ML IJ SOLN
INTRAMUSCULAR | Status: AC
  Administered 2014-05-13: 0.25 mg
  Filled 2014-05-13: qty 1

## 2014-05-13 MED ORDER — PROPOFOL 10 MG/ML IV BOLUS
INTRAVENOUS | Status: AC
  Filled 2014-05-13: qty 20

## 2014-05-13 MED ORDER — PROMETHAZINE HCL 25 MG/ML IJ SOLN
6.2500 mg | INTRAMUSCULAR | Status: DC | PRN
  Administered 2014-05-13: 6.25 mg via INTRAVENOUS

## 2014-05-13 MED ORDER — ACETAMINOPHEN 10 MG/ML IV SOLN
INTRAVENOUS | Status: AC
  Filled 2014-05-13: qty 100

## 2014-05-13 MED ORDER — HYDROMORPHONE HCL 2 MG PO TABS
2.0000 mg | ORAL_TABLET | ORAL | Status: DC | PRN
Start: 2014-05-13 — End: 2014-05-14

## 2014-05-13 MED ORDER — ZOLPIDEM TARTRATE 5 MG PO TABS: 5.0000 mg | ORAL_TABLET | Freq: Every evening | ORAL | Status: DC | PRN

## 2014-05-13 MED ORDER — ONDANSETRON HCL 4 MG/2ML IJ SOLN
INTRAMUSCULAR | Status: AC
  Filled 2014-05-13: qty 2

## 2014-05-13 MED ORDER — EPHEDRINE SULFATE 50 MG/ML IJ SOLN
INTRAMUSCULAR | Status: DC | PRN
  Administered 2014-05-13: 30 mg via INTRAVENOUS

## 2014-05-13 MED ORDER — ONDANSETRON HCL 4 MG/2ML IJ SOLN
4.0000 mg | Freq: Four times a day (QID) | INTRAMUSCULAR | Status: DC | PRN
  Administered 2014-05-13: 4 mg via INTRAVENOUS

## 2014-05-13 MED ORDER — ROCURONIUM BROMIDE 100 MG/10ML IV SOLN
INTRAVENOUS | Status: DC | PRN
  Administered 2014-05-13: 40 mg via INTRAVENOUS

## 2014-05-13 MED ORDER — BUPIVACAINE HCL (PF) 0.25 % IJ SOLN
INTRAMUSCULAR | Status: AC
  Filled 2014-05-13: qty 30

## 2014-05-13 MED ORDER — 0.9 % SODIUM CHLORIDE (POUR BTL) OPTIME
TOPICAL | Status: DC | PRN
  Administered 2014-05-13: 1000 mL

## 2014-05-13 MED ORDER — SODIUM CHLORIDE 0.9 % IR SOLN
Status: DC | PRN
  Administered 2014-05-13: 1

## 2014-05-13 MED ORDER — CEFAZOLIN SODIUM-DEXTROSE 2-3 GM-% IV SOLR
2.0000 g | Freq: Four times a day (QID) | INTRAVENOUS | Status: AC
  Administered 2014-05-13 (×2): 2 g via INTRAVENOUS
  Filled 2014-05-13 (×2): qty 50

## 2014-05-13 MED ORDER — METHOCARBAMOL 500 MG PO TABS: 500.0000 mg | ORAL_TABLET | Freq: Four times a day (QID) | ORAL | Status: DC | PRN

## 2014-05-13 MED ORDER — POTASSIUM CHLORIDE IN NACL 20-0.45 MEQ/L-% IV SOLN
INTRAVENOUS | Status: DC
  Administered 2014-05-13: 14:00:00 via INTRAVENOUS
  Filled 2014-05-13 (×3): qty 1000

## 2014-05-13 MED ORDER — FENTANYL CITRATE 0.05 MG/ML IJ SOLN
INTRAMUSCULAR | Status: AC
  Filled 2014-05-13: qty 5

## 2014-05-13 MED ORDER — LOSARTAN POTASSIUM 50 MG PO TABS
50.0000 mg | ORAL_TABLET | Freq: Every day | ORAL | Status: DC
  Administered 2014-05-14: 50 mg via ORAL
  Filled 2014-05-13: qty 1

## 2014-05-13 MED ORDER — LACTATED RINGERS IV SOLN
INTRAVENOUS | Status: DC | PRN
  Administered 2014-05-13 (×2): via INTRAVENOUS

## 2014-05-13 MED ORDER — ACETAMINOPHEN 500 MG PO TABS
1000.0000 mg | ORAL_TABLET | Freq: Four times a day (QID) | ORAL | Status: DC
  Administered 2014-05-13 – 2014-05-14 (×3): 1000 mg via ORAL
  Filled 2014-05-13 (×3): qty 2

## 2014-05-13 MED ORDER — POLYETHYLENE GLYCOL 3350 17 G PO PACK: 17.0000 g | PACK | Freq: Every day | ORAL | Status: DC | PRN

## 2014-05-13 MED ORDER — ALUM & MAG HYDROXIDE-SIMETH 200-200-20 MG/5ML PO SUSP: 30.0000 mL | ORAL | Status: DC | PRN

## 2014-05-13 MED ORDER — NEOSTIGMINE METHYLSULFATE 10 MG/10ML IV SOLN
INTRAVENOUS | Status: DC | PRN
  Administered 2014-05-13: 3 mg via INTRAVENOUS

## 2014-05-13 MED ORDER — DEXAMETHASONE SODIUM PHOSPHATE 4 MG/ML IJ SOLN
INTRAMUSCULAR | Status: DC | PRN
Start: 2014-05-13 — End: 2014-05-13
  Administered 2014-05-13: 4 mg via INTRAVENOUS

## 2014-05-13 MED ORDER — KETOROLAC TROMETHAMINE 15 MG/ML IJ SOLN
15.0000 mg | Freq: Four times a day (QID) | INTRAMUSCULAR | Status: AC
  Administered 2014-05-13 – 2014-05-14 (×4): 15 mg via INTRAVENOUS
  Filled 2014-05-13 (×4): qty 1

## 2014-05-13 MED ORDER — ONDANSETRON HCL 4 MG/2ML IJ SOLN
INTRAMUSCULAR | Status: AC
Start: 2014-05-13 — End: 2014-05-13
  Filled 2014-05-13: qty 2

## 2014-05-13 MED ORDER — SENNA-DOCUSATE SODIUM 8.6-50 MG PO TABS: 2.0000 | ORAL_TABLET | Freq: Every day | ORAL | Status: DC

## 2014-05-13 MED ORDER — BUPIVACAINE HCL (PF) 0.25 % IJ SOLN
INTRAMUSCULAR | Status: DC | PRN
  Administered 2014-05-13: 30 mL

## 2014-05-13 MED ORDER — GLYCOPYRROLATE 0.2 MG/ML IJ SOLN
INTRAMUSCULAR | Status: AC
  Filled 2014-05-13: qty 1

## 2014-05-13 MED ORDER — BACLOFEN 10 MG PO TABS: 10.0000 mg | ORAL_TABLET | Freq: Three times a day (TID) | ORAL | Status: DC

## 2014-05-13 MED ORDER — DEXAMETHASONE SODIUM PHOSPHATE 10 MG/ML IJ SOLN
10.0000 mg | Freq: Once | INTRAMUSCULAR | Status: AC
  Administered 2014-05-14: 10 mg via INTRAVENOUS
  Filled 2014-05-13: qty 1

## 2014-05-13 MED ORDER — SCOPOLAMINE 1 MG/3DAYS TD PT72
1.0000 | MEDICATED_PATCH | TRANSDERMAL | Status: DC
  Administered 2014-05-13: 1 via TRANSDERMAL
  Filled 2014-05-13: qty 1

## 2014-05-13 MED ORDER — MEPERIDINE HCL 25 MG/ML IJ SOLN: 6.2500 mg | INTRAMUSCULAR | Status: DC | PRN

## 2014-05-13 MED ORDER — METOCLOPRAMIDE HCL 5 MG/ML IJ SOLN: 5.0000 mg | Freq: Three times a day (TID) | INTRAMUSCULAR | Status: DC | PRN

## 2014-05-13 MED ORDER — HYDROMORPHONE HCL 2 MG PO TABS: 2.0000 mg | ORAL_TABLET | ORAL | Status: DC | PRN

## 2014-05-13 MED ORDER — MAGNESIUM CITRATE PO SOLN: 1.0000 | Freq: Once | ORAL | Status: AC | PRN

## 2014-05-13 MED ORDER — NEOSTIGMINE METHYLSULFATE 10 MG/10ML IV SOLN
INTRAVENOUS | Status: AC
  Filled 2014-05-13: qty 3

## 2014-05-13 MED ORDER — ONDANSETRON HCL 4 MG PO TABS: 4.0000 mg | ORAL_TABLET | Freq: Three times a day (TID) | ORAL | Status: DC | PRN

## 2014-05-13 MED ORDER — BISACODYL 10 MG RE SUPP: 10.0000 mg | Freq: Every day | RECTAL | Status: DC | PRN

## 2014-05-13 MED ORDER — GLYCOPYRROLATE 0.2 MG/ML IJ SOLN
INTRAMUSCULAR | Status: DC | PRN
  Administered 2014-05-13: .6 mg via INTRAVENOUS
  Administered 2014-05-13: .2 mg via INTRAVENOUS

## 2014-05-13 MED ORDER — RIVAROXABAN 10 MG PO TABS: 10.0000 mg | ORAL_TABLET | Freq: Every day | ORAL | Status: DC

## 2014-05-13 MED ORDER — ATROPINE SULFATE 0.1 MG/ML IJ SOLN
INTRAMUSCULAR | Status: AC
  Filled 2014-05-13: qty 10

## 2014-05-13 MED ORDER — CALCIUM CARBONATE ANTACID 500 MG PO CHEW
1.0000 | CHEWABLE_TABLET | Freq: Two times a day (BID) | ORAL | Status: DC
  Administered 2014-05-14: 200 mg via ORAL
  Filled 2014-05-13 (×4): qty 1

## 2014-05-13 MED ORDER — PHENOL 1.4 % MT LIQD: 1.0000 | OROMUCOSAL | Status: DC | PRN

## 2014-05-13 MED ORDER — ONDANSETRON HCL 4 MG PO TABS: 4.0000 mg | ORAL_TABLET | Freq: Four times a day (QID) | ORAL | Status: DC | PRN

## 2014-05-13 MED ORDER — FENTANYL CITRATE 0.05 MG/ML IJ SOLN
INTRAMUSCULAR | Status: DC | PRN
  Administered 2014-05-13 (×4): 50 ug via INTRAVENOUS
  Administered 2014-05-13: 100 ug via INTRAVENOUS

## 2014-05-13 MED ORDER — MIDAZOLAM HCL 5 MG/5ML IJ SOLN
INTRAMUSCULAR | Status: DC | PRN
  Administered 2014-05-13: 2 mg via INTRAVENOUS

## 2014-05-13 MED ORDER — SCOPOLAMINE 1 MG/3DAYS TD PT72
MEDICATED_PATCH | TRANSDERMAL | Status: AC
  Filled 2014-05-13: qty 1

## 2014-05-13 MED ORDER — HYDROMORPHONE HCL 1 MG/ML IJ SOLN: 0.5000 mg | INTRAMUSCULAR | Status: DC | PRN

## 2014-05-13 MED ORDER — PROPOFOL 10 MG/ML IV BOLUS
INTRAVENOUS | Status: DC | PRN
  Administered 2014-05-13: 130 mg via INTRAVENOUS

## 2014-05-13 MED ORDER — DEXAMETHASONE SODIUM PHOSPHATE 4 MG/ML IJ SOLN
INTRAMUSCULAR | Status: AC
  Filled 2014-05-13: qty 1

## 2014-05-13 MED ORDER — ONDANSETRON HCL 4 MG/2ML IJ SOLN
INTRAMUSCULAR | Status: DC | PRN
  Administered 2014-05-13: 4 mg via INTRAVENOUS

## 2014-05-13 MED ORDER — CEFAZOLIN SODIUM-DEXTROSE 2-3 GM-% IV SOLR
INTRAVENOUS | Status: DC | PRN
  Administered 2014-05-13: 2 g via INTRAVENOUS

## 2014-05-13 MED ORDER — IBUPROFEN 800 MG PO TABS
800.0000 mg | ORAL_TABLET | Freq: Four times a day (QID) | ORAL | Status: DC | PRN
  Administered 2014-05-13: 800 mg via ORAL
  Filled 2014-05-13 (×2): qty 1

## 2014-05-13 MED ORDER — DIPHENHYDRAMINE HCL 12.5 MG/5ML PO ELIX: 12.5000 mg | ORAL_SOLUTION | ORAL | Status: DC | PRN

## 2014-05-13 MED ORDER — DOCUSATE SODIUM 100 MG PO CAPS
100.0000 mg | ORAL_CAPSULE | Freq: Two times a day (BID) | ORAL | Status: DC
  Administered 2014-05-13 – 2014-05-14 (×2): 100 mg via ORAL
  Filled 2014-05-13 (×4): qty 1

## 2014-05-13 MED ORDER — SENNA 8.6 MG PO TABS
1.0000 | ORAL_TABLET | Freq: Two times a day (BID) | ORAL | Status: DC
  Administered 2014-05-13 (×2): 8.6 mg via ORAL
  Filled 2014-05-13 (×4): qty 1

## 2014-05-13 MED ORDER — PROMETHAZINE HCL 25 MG/ML IJ SOLN
INTRAMUSCULAR | Status: AC
  Filled 2014-05-13: qty 1

## 2014-05-13 MED ORDER — FENTANYL CITRATE 0.05 MG/ML IJ SOLN: 25.0000 ug | INTRAMUSCULAR | Status: DC | PRN

## 2014-05-13 MED ORDER — VITAMIN D3 25 MCG (1000 UNIT) PO TABS
1000.0000 [IU] | ORAL_TABLET | Freq: Two times a day (BID) | ORAL | Status: DC
  Administered 2014-05-14: 1000 [IU] via ORAL
  Filled 2014-05-13 (×4): qty 1

## 2014-05-13 MED ORDER — HYDROCHLOROTHIAZIDE 12.5 MG PO CAPS
12.5000 mg | ORAL_CAPSULE | Freq: Every day | ORAL | Status: DC
  Administered 2014-05-14: 12.5 mg via ORAL
  Filled 2014-05-13: qty 1

## 2014-05-13 MED ORDER — METHOCARBAMOL 1000 MG/10ML IJ SOLN
500.0000 mg | Freq: Four times a day (QID) | INTRAVENOUS | Status: DC | PRN
  Filled 2014-05-13: qty 5

## 2014-05-13 MED ORDER — ACETAMINOPHEN 10 MG/ML IV SOLN
INTRAVENOUS | Status: DC | PRN
  Administered 2014-05-13: 1000 mg via INTRAVENOUS

## 2014-05-13 MED ORDER — LOSARTAN POTASSIUM-HCTZ 50-12.5 MG PO TABS: 1.0000 | ORAL_TABLET | Freq: Every day | ORAL | Status: DC

## 2014-05-13 MED ORDER — SODIUM CHLORIDE 0.9 % IJ SOLN
INTRAMUSCULAR | Status: AC
  Filled 2014-05-13: qty 3

## 2014-05-13 SURGICAL SUPPLY — 60 items
APL SKNCLS STERI-STRIP NONHPOA (GAUZE/BANDAGES/DRESSINGS) ×1
BENZOIN TINCTURE PRP APPL 2/3 (GAUZE/BANDAGES/DRESSINGS) ×3 IMPLANT
BLADE SAW SAG 73X25 THK (BLADE) ×2
BLADE SAW SGTL 73X25 THK (BLADE) ×1 IMPLANT
BRUSH FEMORAL CANAL (MISCELLANEOUS) IMPLANT
CAPT HIP PF MOP ×2 IMPLANT
CLOSURE STERI-STRIP 1/2X4 (GAUZE/BANDAGES/DRESSINGS) ×1
CLSR STERI-STRIP ANTIMIC 1/2X4 (GAUZE/BANDAGES/DRESSINGS) ×3 IMPLANT
COVER SURGICAL LIGHT HANDLE (MISCELLANEOUS) ×3 IMPLANT
DRAPE INCISE IOBAN 66X45 STRL (DRAPES) IMPLANT
DRAPE ORTHO SPLIT 77X108 STRL (DRAPES) ×6
DRAPE PROXIMA HALF (DRAPES) ×3 IMPLANT
DRAPE SURG ORHT 6 SPLT 77X108 (DRAPES) ×2 IMPLANT
DRAPE U-SHAPE 47X51 STRL (DRAPES) ×3 IMPLANT
DRILL BIT 5/64 (BIT) ×3 IMPLANT
DRSG AQUACEL AG ADV 3.5X10 (GAUZE/BANDAGES/DRESSINGS) ×4 IMPLANT
DRSG MEPILEX BORDER 4X12 (GAUZE/BANDAGES/DRESSINGS) IMPLANT
DRSG MEPILEX BORDER 4X8 (GAUZE/BANDAGES/DRESSINGS) IMPLANT
DRSG PAD ABDOMINAL 8X10 ST (GAUZE/BANDAGES/DRESSINGS) IMPLANT
DURAPREP 26ML APPLICATOR (WOUND CARE) ×3 IMPLANT
ELECT CAUTERY BLADE 6.4 (BLADE) ×3 IMPLANT
ELECT REM PT RETURN 9FT ADLT (ELECTROSURGICAL) ×3
ELECTRODE REM PT RTRN 9FT ADLT (ELECTROSURGICAL) ×1 IMPLANT
GAUZE SPONGE 4X4 12PLY STRL (GAUZE/BANDAGES/DRESSINGS) ×1 IMPLANT
GLOVE BIOGEL PI ORTHO PRO SZ8 (GLOVE) ×2
GLOVE ORTHO TXT STRL SZ7.5 (GLOVE) ×3 IMPLANT
GLOVE PI ORTHO PRO STRL SZ8 (GLOVE) ×1 IMPLANT
GLOVE SURG ORTHO 8.0 STRL STRW (GLOVE) ×3 IMPLANT
GOWN STRL REUS W/ TWL LRG LVL3 (GOWN DISPOSABLE) ×1 IMPLANT
GOWN STRL REUS W/TWL LRG LVL3 (GOWN DISPOSABLE) ×6
HANDPIECE INTERPULSE COAX TIP (DISPOSABLE)
HOOD PEEL AWAY FACE SHEILD DIS (HOOD) ×6 IMPLANT
KIT BASIN OR (CUSTOM PROCEDURE TRAY) ×3 IMPLANT
KIT ROOM TURNOVER OR (KITS) ×3 IMPLANT
MANIFOLD NEPTUNE II (INSTRUMENTS) ×3 IMPLANT
NDL HYPO 25GX1X1/2 BEV (NEEDLE) ×1 IMPLANT
NEEDLE HYPO 25GX1X1/2 BEV (NEEDLE) ×3 IMPLANT
NS IRRIG 1000ML POUR BTL (IV SOLUTION) ×3 IMPLANT
PACK TOTAL JOINT (CUSTOM PROCEDURE TRAY) ×3 IMPLANT
PAD ARMBOARD 7.5X6 YLW CONV (MISCELLANEOUS) ×6 IMPLANT
PILLOW ABDUCTION HIP (SOFTGOODS) ×3 IMPLANT
PRESSURIZER FEMORAL UNIV (MISCELLANEOUS) IMPLANT
RETRIEVER SUT HEWSON (MISCELLANEOUS) ×3 IMPLANT
SET HNDPC FAN SPRY TIP SCT (DISPOSABLE) IMPLANT
SPONGE LAP 4X18 X RAY DECT (DISPOSABLE) IMPLANT
SUCTION FRAZIER TIP 10 FR DISP (SUCTIONS) ×3 IMPLANT
SUT FIBERWIRE #2 38 REV NDL BL (SUTURE) ×9
SUT MNCRL AB 4-0 PS2 18 (SUTURE) ×3 IMPLANT
SUT VIC AB 0 CT1 27 (SUTURE) ×3
SUT VIC AB 0 CT1 27XBRD ANBCTR (SUTURE) ×1 IMPLANT
SUT VIC AB 2-0 CT1 27 (SUTURE) ×3
SUT VIC AB 2-0 CT1 TAPERPNT 27 (SUTURE) ×1 IMPLANT
SUT VIC AB 3-0 SH 8-18 (SUTURE) ×3 IMPLANT
SUTURE FIBERWR#2 38 REV NDL BL (SUTURE) ×3 IMPLANT
SYR CONTROL 10ML LL (SYRINGE) ×3 IMPLANT
TOWEL OR 17X24 6PK STRL BLUE (TOWEL DISPOSABLE) ×3 IMPLANT
TOWEL OR 17X26 10 PK STRL BLUE (TOWEL DISPOSABLE) ×3 IMPLANT
TOWER CARTRIDGE SMART MIX (DISPOSABLE) IMPLANT
TRAY FOLEY CATH 14FR (SET/KITS/TRAYS/PACK) IMPLANT
WATER STERILE IRR 1000ML POUR (IV SOLUTION) ×2 IMPLANT

## 2014-05-13 NOTE — Discharge Instructions (Signed)
Diet: As you were doing prior to hospitalization  ° °Shower:  May shower but keep the wounds dry, use an occlusive plastic wrap, NO SOAKING IN TUB.  If the bandage gets wet, change with a clean dry gauze. ° °Dressing:  You may change your dressing 3-5 days after surgery.  Then change the dressing daily with sterile gauze dressing.   ° °There are sticky tapes (steri-strips) on your wounds and all the stitches are absorbable.  Leave the steri-strips in place when changing your dressings, they will peel off with time, usually 2-3 weeks. ° °Activity:  Increase activity slowly as tolerated, but follow the weight bearing instructions below.  No lifting or driving for 6 weeks. ° °Weight Bearing:   As tolerated.   ° °To prevent constipation: you may use a stool softener such as - ° °Colace (over the counter) 100 mg by mouth twice a day  °Drink plenty of fluids (prune juice may be helpful) and high fiber foods °Miralax (over the counter) for constipation as needed.   ° °Itching:  If you experience itching with your medications, try taking only a single pain pill, or even half a pain pill at a time.  You may take up to 10 pain pills per day, and you can also use benadryl over the counter for itching or also to help with sleep.  ° °Precautions:  If you experience chest pain or shortness of breath - call 911 immediately for transfer to the hospital emergency department!! ° °If you develop a fever greater that 101 F, purulent drainage from wound, increased redness or drainage from wound, or calf pain -- Call the office at [phone]                                                °Follow- Up Appointment:  Please call for an appointment to be seen in 2 weeks [place] - [phone] ° ° ° ° ° °

## 2014-05-13 NOTE — Progress Notes (Signed)
Utilization review completed.  

## 2014-05-13 NOTE — H&P (Signed)
PREOPERATIVE H&P  Chief Complaint: LEFT OSTEOARTHRITIS WITH FEMORAL HEAD COLLAPSE  HPI: Sharon Riddle is a 53 y.o. female who presents for preoperative history and physical with a diagnosis of left hip osteoarthritis with femoral head collapse. Symptoms are rated as moderate to severe, and have been worsening.  This is significantly impairing activities of daily living.  She has elected for surgical management. She developed the acute onset inability to walk, requiring crutches. She has severe pain, and has failed conservative measures. X-rays demonstrate a collapsed cyst within the femoral head. Nonsurgical measures would not be appropriate given the deformity and severity of pain.  Past Medical History  Diagnosis Date  . Arthritis   . Loose body of right knee 04/18/2012  . Hypertension     NSAID related  . Complication of anesthesia     Post anesthesia shivers  . PONV (postoperative nausea and vomiting)     N/V with pain medications  . Vertigo     hx of  . History of kidney stones    Past Surgical History  Procedure Laterality Date  . Tonsillectomy    . Knee arthroscopy  04/18/2012    Procedure: ARTHROSCOPY KNEE;  Surgeon: Johnny Bridge, MD;  Location: [place];  Service: Orthopedics;  Laterality: Right;  Removal of Loose Bodies-2   History   Social History  . Marital Status: Married    Spouse Name: N/A    Number of Children: 2  . 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 Smoking status: Never Smoker   . Smokeless tobacco: Never Used  . Alcohol Use: Yes     Comment: social, red wine 1 glass 3-4 drinks per week  . Drug Use: No  . Sexual Activity: Not on file   Other Topics Concern  . Not on file   Social History Narrative   She lives with husband.   Bedroom is on the first floor.     Education: Phd in nursing.  Has 2 grown children.   Family History  Problem Relation Age of Onset   . Diabetes Mother   . Goiter Mother   . Colon polyps Father   . Addison's disease Sister   . Lymphoma Mother     Deceased, 88  . Bladder Cancer Father     Deceased 77  . Crohn's disease Son   . Healthy Daughter   . Healthy Brother    Allergies  Allergen Reactions  . Other Nausea And Vomiting and Other (See Comments)    Pain medications and muscle relaxers, can be given with Zofran  . Tramadol Other (See Comments)    Passed out  . Nsaids     GI upset, elevates blood pressure  . Pennsaid [Diclofenac Sodium]     Elevates blood pressure  . Sulfonamide Derivatives     REACTION: rash   Prior to Admission medications   Medication Sig Start Date End Date Taking? Authorizing Provider  calcium carbonate (TUMS - DOSED IN MG ELEMENTAL CALCIUM) 500 MG chewable tablet Chew 1 tablet by mouth 2 (two) times daily.     Yes Historical Provider, MD  Cholecalciferol (VITAMIN D-3) 1000 UNITS CAPS Take 1 capsule by mouth 2 (two) times daily.   Yes Historical Provider, MD  ibuprofen (ADVIL,MOTRIN) 200 MG tablet Take 800 mg by mouth every 6 (six) hours as needed (pain).   Yes Historical Provider, MD  losartan-hydrochlorothiazide Konrad Penta)  50-12.5 MG per tablet Take 1 tablet by mouth daily.   Yes Historical Provider, MD  meclizine (ANTIVERT) 25 MG tablet Take 1 tablet (25 mg total) by mouth 3 (three) times daily as needed for dizziness. 11/25/13  Yes Amy Cletis [place], MD     Positive ROS: All other systems have been reviewed and were otherwise negative with the exception of those mentioned in the HPI and as above.  Physical Exam: General: Alert, no acute distress Cardiovascular: No pedal edema Respiratory: No cyanosis, no use of accessory musculature GI: No organomegaly, abdomen is soft and non-tender Skin: No lesions in the area of chief complaint Neurologic: Sensation intact distally Psychiatric: Patient is competent for consent with normal mood and affect Lymphatic: No axillary or cervical  lymphadenopathy  MUSCULOSKELETAL: Left hip has passive range of motion 0-80 at most with almost no internal or external rotation. Fixation is intact distally in EHL and FHL are intact.  X-rays demonstrates evidence for severe endstage degenerative changes with large osteophyte formation and a large cyst within the femoral head that I believe has collapsed.  Assessment: Left hip osteoarthritis with large cyst formation and collapse of the femoral head symptomatically equivalent to a femoral head fracture.  Plan: Plan for Procedure(s): LEFT TOTAL HIP ARTHROPLASTY  The risks benefits and alternatives were discussed with the patient including but not limited to the risks of nonoperative treatment, versus surgical intervention including infection, bleeding, nerve injury, periprosthetic fracture, the need for revision surgery, dislocation, leg length discrepancy, blood clots, cardiopulmonary complications, morbidity, mortality, among others, and they were willing to proceed.     Johnny Bridge, MD Cell [phone]   05/13/2014 5:42 AM

## 2014-05-13 NOTE — Anesthesia Postprocedure Evaluation (Signed)
  Anesthesia Post-op Note  Patient: Sharon Riddle  Procedure(s) Performed: Procedure(s): LEFT TOTAL HIP ARTHROPLASTY (Left)  Patient Location: PACU  Anesthesia Type:General  Level of Consciousness: awake, alert  and oriented  Airway and Oxygen Therapy: Patient Spontanous Breathing and Patient connected to nasal cannula oxygen  Post-op Pain: mild  Post-op Assessment: Post-op Vital signs reviewed, Patient's Cardiovascular Status Stable, Respiratory Function Stable, Patent Airway and No signs of Nausea or vomiting  Post-op Vital Signs: Reviewed and stable  Last Vitals:  Filed Vitals:   05/13/14 1000  BP: 120/68  Pulse: 78  Temp: 36.4 C  Resp: 19    Complications: No apparent anesthesia complications

## 2014-05-13 NOTE — Op Note (Signed)
05/13/2014  9:25 AM  PATIENT:  Sharon Riddle   MRN: [id number]  PRE-OPERATIVE DIAGNOSIS:  LEFT HIP FRACTURE  POST-OPERATIVE DIAGNOSIS:  LEFT HIP FRACTURE  PROCEDURE:  Procedure(s): LEFT TOTAL HIP ARTHROPLASTY  PREOPERATIVE INDICATIONS:    Sharon Riddle is an 53 y.o. female who has a diagnosis of Osteoarthritis of left hip and elected for surgical management after failing conservative treatment.  The risks benefits and alternatives were discussed with the patient including but not limited to the risks of nonoperative treatment, versus surgical intervention including infection, bleeding, nerve injury, periprosthetic fracture, the need for revision surgery, dislocation, leg length discrepancy, blood clots, cardiopulmonary complications, morbidity, mortality, among others, and they were willing to proceed.     OPERATIVE REPORT     SURGEON:  Marchia Bond, MD    ASSISTANT:  Joya Gaskins, OPA-C  (Present throughout the entire procedure,  necessary for completion of procedure in a timely manner, assisting with retraction, instrumentation, and closure)   Second assistant: Morley Kos, orthopedic PA-C    ANESTHESIA:  General    COMPLICATIONS:  None.     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 I had to medialize a fair amount, and after inserting the cup the first time, it did not have adequate hold, and was not deep enough within the pelvis, so I had to remove the cup and then reamed deeper in order to have adequate fixation. Ultimately I had excellent fixation.  During the trial process, with the +5 head the length was restored, and she had excellent stability, but I could not reduce her  with a 10 lipped liner because the liner block the reduction. I used a neutral liner, which allowed for reduction, had excellent posterior stability even with the neutral liner, and had excellent restoration of length. Therefore I utilized a neutral liner, with a +5 head which was the best configuration.  PROCEDURE IN DETAIL:   The patient was met in the holding area and  identified.  The appropriate hip was identified and marked at the operative site.  The patient was then transported to the OR  and  placed under general anesthesia.  At that point, the patient was  placed in the lateral decubitus position with the operative side up and  secured to the operating room table and all bony prominences padded.     The operative lower extremity was prepped from the iliac crest to the distal leg.  Sterile draping was performed.  Time out was performed prior to incision.      A routine posterolateral approach was utilized via sharp dissection  carried down to the subcutaneous tissue.  Gross bleeders were Bovie coagulated.  The iliotibial band was identified and incised along the length of the skin incision.  Self-retaining retractors were  inserted.  With the hip internally rotated, the short external rotators  were identified. The piriformis and capsule was tagged with FiberWire, and the hip capsule released in a T-type fashion.  The femoral neck was exposed, and I resected the femoral neck using the appropriate jig. This was performed at approximately a thumb's breadth above the lesser trochanter.    I then exposed the  deep acetabulum, cleared out any tissue including the ligamentum teres.  A wing retractor was placed.  After adequate visualization, I excised the labrum, and then sequentially reamed.  I placed the trial acetabulum, which seated nicely, and then impacted the real cup into place.  Appropriate version and inclination was confirmed clinically matching their bony anatomy, and also with the use of  the jig. I did initially place the cup as indicated above, but was not happy with the fixation, and reamed deeper into the pelvis to get better bony coverage. This was a side effect I believe from the significant dysplasia.  A trial polyethylene liner was placed and the wing retractor removed.    I then prepared the proximal femur using the cookie-cutter, the lateralizing reamer, and then sequentially reamed and broached.  A trial broach, neck, and head was utilized, and I reduced the hip and it was found to have excellent stability with functional range of motion. The trial components were then removed, and the real polyethylene liner was placed with the lip directed posteriorly.  I then impacted the real femoral prosthesis into place into the appropriate version, slightly anteverted to the normal anatomy, and I impacted the real head ball into place. The hip was then reduced and taken through functional range of motion and found to have excellent stability. Leg lengths were restored.  I then used a 2 mm drill bits to pass the FiberWire suture from the capsule and piriformis through the greater trochanter, and secured this. Excellent posterior capsular repair was achieved. I also closed the T in the capsule.  I then irrigated the hip copiously again with pulse lavage, and repaired the fascia with Vicryl, followed by Vicryl for the subcutaneous tissue, Monocryl for the skin, Steri-Strips and sterile gauze. The wounds were injected. The patient was then awakened and returned to PACU in stable and satisfactory condition. There were no complications.  Marchia Bond, MD Orthopedic Surgeon [phone]   05/13/2014 9:25 AM

## 2014-05-13 NOTE — Evaluation (Signed)
Physical Therapy Evaluation Patient Details Name: Sharon Riddle MRN: [id number] DOB: 1960-11-03 Today's Date: 05/13/2014   History of Present Illness  Pt admitted for L THA  due to OA with femoral head collapse  Clinical Impression  Pt very pleasant and moving exceptionally well for POD#0. Pt and spouse educated for precautions, mobility, transfers and gait. Pt has been using crutches for the last 2 weeks and wishes to try those next session but states RW may be safest in home due to her 3 legged dog. Pt will benefit from acute therapy to maximize mobility, function, strength and gait adhering to precautions to decrease burden of care.     Follow Up Recommendations Home health PT    Equipment Recommendations  Rolling walker with 5" wheels;3in1 (PT)    Recommendations for Other Services       Precautions / Restrictions Precautions Precautions: Posterior Hip Precaution Booklet Issued: Yes (comment) Precaution Comments: hip abduction pillow at rest Restrictions Weight Bearing Restrictions: Yes LLE Weight Bearing: Weight bearing as tolerated      Mobility  Bed Mobility Overal bed mobility: Needs Assistance Bed Mobility: Supine to Sit     Supine to sit: Supervision     General bed mobility comments: cues for sequence only no physical assist  Transfers Overall transfer level: Needs assistance   Transfers: Sit to/from Stand Sit to Stand: Supervision         General transfer comment: cues for hand placement and LLE position  Ambulation/Gait Ambulation/Gait assistance: Supervision Ambulation Distance (Feet): 80 Feet Assistive device: Rolling walker (2 wheeled) Gait Pattern/deviations: Step-to pattern;Decreased stance time - left;Decreased dorsiflexion - left   Gait velocity interpretation: Below normal speed for age/gender General Gait Details: initially pt with tendency for TDWB with progression to increased weight bearing with cues for heel strike and midline foot  position (initial slight internal rotation)  Stairs            Wheelchair Mobility    Modified Rankin (Stroke Patients Only)       Balance                                             Pertinent Vitals/Pain Pain Assessment: 0-10 Pain Score: 6  Pain Location: left hip Pain Descriptors / Indicators: Aching Pain Intervention(s): Premedicated before session;Repositioned;Limited activity within patient's tolerance    Home Living Family/patient expects to be discharged to:: Private residence Living Arrangements: Spouse/significant other Available Help at Discharge: Family;Available 24 hours/day Type of Home: House Home Access: Stairs to enter Entrance Stairs-Rails: None Entrance Stairs-Number of Steps: 2 Home Layout: One level Home Equipment: None      Prior Function Level of Independence: Independent         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        Extremity/Trunk Assessment   Upper Extremity Assessment: Overall WFL for tasks assessed           Lower Extremity Assessment: LLE deficits/detail   LLE Deficits / Details: decreased ROM as expected post op pain  Cervical / Trunk Assessment: Normal  Communication   Communication: No difficulties  Cognition Arousal/Alertness: Awake/alert Behavior During Therapy: WFL for tasks assessed/performed Overall Cognitive Status: Within Functional Limits for tasks assessed  General Comments      Exercises Total Joint Exercises Long Arc Quad: AROM;Left;5 reps;Seated      Assessment/Plan    PT Assessment Patient needs continued PT services  PT Diagnosis Abnormality of gait;Acute pain   PT Problem List Decreased range of motion;Decreased strength;Decreased activity tolerance;Pain;Decreased safety awareness;Decreased knowledge of use of DME;Decreased mobility;Decreased knowledge of precautions  PT Treatment Interventions DME instruction;Gait  training;Stair training;Functional mobility training;Patient/family education;Therapeutic activities;Therapeutic exercise   PT Goals (Current goals can be found in the Care Plan section) Acute Rehab PT Goals Patient Stated Goal: return to work and walking normally PT Goal Formulation: With patient/family Time For Goal Achievement: 05/20/14 Potential to Achieve Goals: Good    Frequency 7X/week   Barriers to discharge        Co-evaluation               End of Session   Activity Tolerance: Patient tolerated treatment well Patient left: in chair;with call bell/phone within reach;with family/visitor present Nurse Communication: Mobility status;Precautions;Weight bearing status         Time: 1401-1420 PT Time Calculation (min): 19 min   Charges:   PT Evaluation $Initial PT Evaluation Tier I: 1 Procedure PT Treatments $Therapeutic Activity: 8-22 mins   PT G CodesMelford Aase 05/13/2014, 2:28 PM Elwyn Reach, [place]

## 2014-05-13 NOTE — Progress Notes (Signed)
Patient refused Scopolamine patch at this time. Patient stated "I took Antivert this morning and I don't want a scopolamine patch." Will return patch in Pyxis.

## 2014-05-13 NOTE — Transfer of Care (Signed)
Immediate Anesthesia Transfer of Care Note  Patient: Sharon Riddle  Procedure(s) Performed: Procedure(s): LEFT TOTAL HIP ARTHROPLASTY (Left)  Patient Location: PACU  Anesthesia Type:General  Level of Consciousness: awake, alert  and oriented  Airway & Oxygen Therapy: Patient Spontanous Breathing  Post-op Assessment: Report given to PACU RN and Post -op Vital signs reviewed and stable  Post vital signs: Reviewed and stable  Complications: No apparent anesthesia complications

## 2014-05-14 ENCOUNTER — Encounter (HOSPITAL_COMMUNITY): Payer: Self-pay | Admitting: Orthopedic Surgery

## 2014-05-14 LAB — BASIC METABOLIC PANEL
Anion gap: 10 (ref 5–15)
BUN: 13 mg/dL (ref 6–23)
CO2: 28 meq/L (ref 19–32)
Calcium: 8.5 mg/dL (ref 8.4–10.5)
Chloride: 97 mEq/L (ref 96–112)
Creatinine, Ser: 0.65 mg/dL (ref 0.50–1.10)
GFR calc non Af Amer: >90 mL/min (ref >90)
Glucose, Bld: 100 mg/dL — ABNORMAL HIGH (ref 70–99)
POTASSIUM: 3.9 meq/L (ref 3.7–5.3)
SODIUM: 135 meq/L — AB (ref 137–147)

## 2014-05-14 LAB — CBC
HCT: 28.7 % — ABNORMAL LOW (ref 36.0–46.0)
Hemoglobin: 9.9 g/dL — ABNORMAL LOW (ref 12.0–15.0)
MCH: 30.7 pg (ref 26.0–34.0)
MCHC: 34.5 g/dL (ref 30.0–36.0)
MCV: 89.1 fL (ref 78.0–100.0)
PLATELETS: 198 10*3/uL (ref 150–400)
RBC: 3.22 MIL/uL — ABNORMAL LOW (ref 3.87–5.11)
RDW: 13.7 % (ref 11.5–15.5)
WBC: 7.2 10*3/uL (ref 4.0–10.5)

## 2014-05-14 NOTE — Progress Notes (Signed)
Patient ID: Sharon Riddle, female   DOB: 09-06-1960, 53 y.o.   MRN: [id number]     Subjective:  Patient reports pain as mild.  Patient up sitting in the chair and in no distress.  Denies any CP or SOB  Objective:   VITALS:   Filed Vitals:   05/13/14 1600 05/13/14 2000 05/13/14 2117 05/14/14 0521  BP:   95/58 103/58  Pulse:   73 78  Temp:   98.7 F (37.1 C) 98.9 F (37.2 C)  TempSrc:      Resp: 18 18 18 18   SpO2: 97% 98% 96% 98%    ABD soft Sensation intact distally Dorsiflexion/Plantar flexion intact Incision: dressing C/D/I and no drainage Good function in the foot knee and ankle  Lab Results  Component Value Date   WBC 7.2 05/14/2014   HGB 9.9* 05/14/2014   HCT 28.7* 05/14/2014   MCV 89.1 05/14/2014   PLT 198 05/14/2014   BMET    Component Value Date/Time   NA 136* 05/06/2014 0836   K 4.5 05/06/2014 0836   CL 99 05/06/2014 0836   CO2 27 05/06/2014 0836   GLUCOSE 80 05/06/2014 0836   BUN 12 05/06/2014 0836   CREATININE 0.58 05/06/2014 0836   CALCIUM 9.6 05/06/2014 0836   GFRNONAA >90 05/06/2014 0836   GFRAA >90 05/06/2014 0836     Assessment/Plan: 1 Day Post-Op   Principal Problem:   Osteoarthritis of left hip Active Problems:   Hip arthritis   Advance diet Up with therapy Discharge home with home health WBAT Dry dressing PRN Follow up with Dr Mardelle Matte in two weeks   Remonia Richter 05/14/2014, 7:17 AM  Seen and agree  Marchia Bond, MD Cell [phone]

## 2014-05-14 NOTE — Evaluation (Signed)
Occupational Therapy Evaluation Patient Details Name: Sharon Riddle MRN: [id number] DOB: Jan 11, 1961 Today's Date: 05/14/2014    History of Present Illness Pt admitted for L THA  due to OA with femoral head collapse   Clinical Impression   Pt admitted with the above diagnoses and presents with below problem list. PTA pt was independent with ADLs and works as an Nutritional therapist. Currently pt is at supervision to min guard level for LB ADLs and functional transfers. ADL education provided. No further OT needs indicated at this time.     Follow Up Recommendations  Supervision - Intermittent;No OT follow up    Equipment Recommendations  3 in 1 bedside comode    Recommendations for Other Services       Precautions / Restrictions Precautions Precautions: Posterior Hip Precaution Comments: reviewed precautions Restrictions Weight Bearing Restrictions: Yes LLE Weight Bearing: Weight bearing as tolerated      Mobility Bed Mobility               General bed mobility comments: pt in recliner  Transfers Overall transfer level: Needs assistance Equipment used: Rolling walker (2 wheeled) Transfers: Sit to/from Stand Sit to Stand: Supervision              Balance Overall balance assessment: Needs assistance         Standing balance support: Bilateral upper extremity supported;During functional activity Standing balance-Leahy Scale: Fair                              ADL Overall ADL's : Needs assistance/impaired Eating/Feeding: Set up;Sitting   Grooming: Set up;Sitting;Standing   Upper Body Bathing: Set up;Sitting   Lower Body Bathing: Min guard;Sit to/from stand;With adaptive equipment   Upper Body Dressing : Set up;Sitting   Lower Body Dressing: Min guard;With adaptive equipment;Sit to/from stand   Toilet Transfer: Supervision/safety;Ambulation;RW (3n1 over toilet)   Toileting- Clothing Manipulation and Hygiene: Supervision/safety;Sit to/from  stand   Tub/ Shower Transfer: Min guard;Ambulation;3 in 1;Rolling walker   Functional mobility during ADLs: Supervision/safety;Rolling walker General ADL Comments: Educated pt on techniques and AE for LB ADLs. Pt practice donning LB dressing with reacher.     Vision                     Perception     Praxis      Pertinent Vitals/Pain Pain Assessment: 0-10 Pain Score: 4  Pain Location: Lt hip Pain Descriptors / Indicators: Aching Pain Intervention(s): Limited activity within patient's tolerance;Monitored during session;Repositioned     Hand Dominance     Extremity/Trunk Assessment Upper Extremity Assessment Upper Extremity Assessment: Overall WFL for tasks assessed   Lower Extremity Assessment Lower Extremity Assessment: Defer to PT evaluation       Communication Communication Communication: No difficulties   Cognition Arousal/Alertness: Awake/alert Behavior During Therapy: WFL for tasks assessed/performed Overall Cognitive Status: Within Functional Limits for tasks assessed                     General Comments       Exercises       Shoulder Instructions      Home Living Family/patient expects to be discharged to:: Private residence Living Arrangements: Spouse/significant other Available Help at Discharge: Family;Available 24 hours/day Type of Home: House Home Access: Stairs to enter [employer] of Steps: 2 Entrance Stairs-Rails: None Home Layout: One level     Bathroom Shower/Tub: Walk-in  shower;Other (comment) (able to fit w and 3n1 in shower)   Bathroom Toilet: Standard Bathroom Accessibility: Yes How Accessible: Accessible via walker Home Equipment: Shower seat - built in          Prior Functioning/Environment Level of Independence: Independent        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    OT Diagnosis: Acute pain   OT Problem List: Impaired balance (sitting and/or standing);Decreased knowledge of use of  DME or AE;Decreased knowledge of precautions;Pain   OT Treatment/Interventions:      OT Goals(Current goals can be found in the care plan section) Acute Rehab OT Goals Patient Stated Goal: not stated OT Goal Formulation: All assessment and education complete, DC therapy  OT Frequency:     Barriers to D/C:            Co-evaluation              End of Session Equipment Utilized During Treatment: Rolling walker  Activity Tolerance: Patient tolerated treatment well Patient left: in chair;with call bell/phone within reach;with family/visitor present   Time: 6063-0160 OT Time Calculation (min): 19 min Charges:  OT General Charges $OT Visit: 1 Procedure OT Evaluation $Initial OT Evaluation Tier I: 1 Procedure OT Treatments $Self Care/Home Management : 8-22 mins G-Codes:    Hortencia Pilar 09-Jun-2014, 9:08 AM

## 2014-05-14 NOTE — Discharge Summary (Signed)
Physician Discharge Summary  Patient ID: Sharon Riddle MRN: [id number] DOB/AGE: 53-11-1960 53 y.o.  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 Arthritis   . Loose body of right knee 04/18/2012  . Hypertension     NSAID related  . Complication of anesthesia     Post anesthesia shivers  . PONV (postoperative nausea and vomiting)     N/V with pain medications  . Vertigo     hx of  . History of kidney stones   . Osteoarthritis of left hip 05/13/2014    Surgeries: Procedure(s): LEFT TOTAL HIP ARTHROPLASTY on 05/13/2014   Consultants (if any):    Discharged Condition: Improved  Hospital Course: Sharon Riddle is an 53 y.o. female who was admitted 05/13/2014 with a diagnosis of Osteoarthritis of left hip and went to the operating room on 05/13/2014 and underwent the above named procedures.    She was given perioperative antibiotics:  Anti-infectives   Start     Dose/Rate Route Frequency Ordered Stop   05/13/14 1400  ceFAZolin (ANCEF) IVPB 2 g/50 mL premix     2 g 100 mL/hr over 30 Minutes Intravenous Every 6 hours 05/13/14 1253 05/13/14 2034   05/13/14 0600  ceFAZolin (ANCEF) IVPB 2 g/50 mL premix  Status:  Discontinued     2 g 100 mL/hr over 30 Minutes Intravenous On call to O.R. 05/12/14 1420 05/13/14 8119    .  She was given sequential compression devices, early ambulation, and xarelto for DVT prophylaxis.  She benefited maximally from the hospital stay and there were no complications.    Recent vital signs:  Filed Vitals:   05/14/14 0521  BP: 103/58  Pulse: 78  Temp: 98.9 F (37.2 C)  Resp: 18    Recent laboratory studies:  Lab Results  Component Value Date   HGB 9.9* 05/14/2014   HGB  13.9 05/06/2014   HGB 12.8 01/01/2012   Lab Results  Component Value Date   WBC 7.2 05/14/2014   PLT 198 05/14/2014   Lab Results  Component Value Date   INR 1.00 05/06/2014   Lab Results  Component Value Date   NA 135* 05/14/2014   K 3.9 05/14/2014   CL 97 05/14/2014   CO2 28 05/14/2014   BUN 13 05/14/2014   CREATININE 0.65 05/14/2014   GLUCOSE 100* 05/14/2014    Discharge Medications:     Medication List         baclofen 10 MG tablet  Commonly known as:  LIORESAL  Take 1 tablet (10 mg total) by mouth 3 (three) times daily. As needed for muscle spasm     calcium carbonate 500 MG chewable tablet  Commonly known as:  TUMS - dosed in mg elemental calcium  Chew 1 tablet by mouth 2 (two) times daily.     HYDROmorphone 2 MG tablet  Commonly known as:  DILAUDID  Take 1 tablet (2 mg total) by mouth every 4 (four) hours as needed for severe pain.     ibuprofen 200 MG tablet  Commonly known as:  ADVIL,MOTRIN  Take 800 mg by mouth every 6 (six) hours as needed (pain).     losartan-hydrochlorothiazide 50-12.5 MG per tablet  Commonly known as:  HYZAAR  Take 1 tablet by mouth daily.     meclizine 25 MG tablet  Commonly known as:  ANTIVERT  Take 1 tablet (25 mg total) by mouth 3 (three) times daily as needed for dizziness.     ondansetron 4 MG tablet  Commonly known as:  ZOFRAN  Take 1 tablet (4 mg total) by mouth every 8 (eight) hours as needed for nausea or vomiting.     rivaroxaban 10 MG Tabs tablet  Commonly known as:  XARELTO  Take 1 tablet (10 mg total) by mouth daily.     sennosides-docusate sodium 8.6-50 MG tablet  Commonly known as:  SENOKOT-S  Take 2 tablets by mouth daily.     Vitamin D-3 1000 UNITS Caps  Take 1 capsule by mouth 2 (two) times daily.        Diagnostic Studies: Dg Chest 2 View  05/06/2014   CLINICAL DATA:  Preoperative evaluation for left total hip arthroplasty, current history of hypertension  EXAM: CHEST  2 VIEW  COMPARISON:  None.   FINDINGS: The heart size and mediastinal contours are within normal limits. Both lungs are clear. The visualized skeletal structures are unremarkable.  IMPRESSION: No active cardiopulmonary disease.   Electronically Signed   By: Skipper Cliche M.D.   On: 05/06/2014 10:13   Dg Pelvis Portable  05/13/2014   CLINICAL DATA:  Post LEFT hip surgery  EXAM: PORTABLE PELVIS 1-2 VIEWS  COMPARISON:  Portable exam 1037 hr compared to 11/15/2010  FINDINGS: Acetabular and femoral components of a LEFT hip prosthesis are identified.  No acute fracture or dislocation.  Bones appear demineralized.  Osteoarthritic changes of RIGHT hip joint space noted.  IMPRESSION: LEFT hip prosthesis without acute complication.   Electronically Signed   By: Lavonia Dana M.D.   On: 05/13/2014 11:00   Dg Hip Portable 1 View Left  05/13/2014   CLINICAL DATA:  Arthritis post hip surgery  EXAM: PORTABLE LEFT HIP - 1 VIEW  COMPARISON:  Portable cross-table lateral view bb 1041 hr compared to 11/15/2010  FINDINGS: Acetabular and femoral components of a LEFT hip prosthesis are identified.  No gross fracture or dislocation seen on single view which is limited by technique, overpenetrated.  IMPRESSION: No gross fracture or dislocation involving LEFT hip prosthesis identified on exam limited by technique.   Electronically Signed   By: Lavonia Dana M.D.   On: 05/13/2014 10:59    Disposition: 01-Home or Self Care      Discharge Instructions   Posterior total hip precautions    Complete by:  As directed      Weight bearing as tolerated    Complete by:  As directed            Follow-up Information   Follow up with Johnny Bridge, MD. Schedule an appointment as soon as possible for a visit in 2 weeks.   Specialty:  Orthopedic Surgery   Contact information:   [place] [id number] [phone]        Signed: Johnny Bridge 05/14/2014, 9:48 AM

## 2014-05-14 NOTE — Progress Notes (Signed)
Physical Therapy Treatment Patient Details Name: Sharon Riddle MRN: [id number] DOB: 06/24/1961 Today's Date: 05/14/2014    History of Present Illness Pt admitted for L THA  due to OA with femoral head collapse    PT Comments    Pt and husband educated on stair management technique. Pt able to perform with (A) from husband to block RW. All questions answered. Pt ready from mobility standpoint to D/C home today.   Follow Up Recommendations  Outpatient PT     Equipment Recommendations  Rolling walker with 5" wheels;3in1 (PT)    Recommendations for Other Services       Precautions / Restrictions Precautions Precautions: Posterior Hip Precaution Comments: reviewed precautions; pt able to recall independently Required Braces or Orthoses: Other Brace/Splint Other Brace/Splint: abduction pillow Restrictions Weight Bearing Restrictions: Yes LLE Weight Bearing: Weight bearing as tolerated    Mobility  Bed Mobility               General bed mobility comments: pt up in chair and returned to chair  Transfers Overall transfer level: Modified independent Equipment used: Rolling walker (2 wheeled) Transfers: Sit to/from Stand Sit to Stand: Supervision         General transfer comment: demo good technique with transfers from sitting   Ambulation/Gait Ambulation/Gait assistance: Supervision Ambulation Distance (Feet): 100 Feet Assistive device: Rolling walker (2 wheeled) Gait Pattern/deviations: Step-to pattern;Decreased stance time - left;Decreased dorsiflexion - left Gait velocity: WFL Gait velocity interpretation: at or above normal speed for age/gender General Gait Details: initially pt ambualting with TDWB on Lt LE and increased step length on Lt; cues for step through gt sequencing and equalized step length; encuoraged knee flexion and extension; progressing well    Stairs Stairs: Yes Stairs assistance: Min guard Stair Management: No rails;Step to  pattern;Backwards;With walker Number of Stairs: 2 General stair comments: pt and husband educated on technique with visual, verbal cues and given handout; both able to demo carryover and practice   Wheelchair Mobility    Modified Rankin (Stroke Patients Only)       Balance Overall balance assessment: No apparent balance deficits (not formally assessed)         Standing balance support: Bilateral upper extremity supported;During functional activity Standing balance-Leahy Scale: Fair                      Cognition Arousal/Alertness: Awake/alert Behavior During Therapy: WFL for tasks assessed/performed Overall Cognitive Status: Within Functional Limits for tasks assessed                      Exercises      General Comments General comments (skin integrity, edema, etc.): reviewed car transfer technique; safety with side sleeping and HEP; encouraged standing exercises on HEP due to high level function       Pertinent Vitals/Pain Pain Assessment: 0-10 Pain Score: 4  Pain Location: Lt hip Pain Descriptors / Indicators: Aching Pain Intervention(s): Monitored during session;Premedicated before session;Repositioned    Home Living Family/patient expects to be discharged to:: Private residence Living Arrangements: Spouse/significant other Available Help at Discharge: Family;Available 24 hours/day Type of Home: House Home Access: Stairs to enter Entrance Stairs-Rails: None Home Layout: One level Home Equipment: Shower seat - built in      Prior Function Level of Independence: Independent      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   PT Goals (current goals can now be found in the care plan section)  Acute Rehab PT Goals Patient Stated Goal: to go home today PT Goal Formulation: All assessment and education complete, DC therapy Progress towards PT goals: Goals met/education completed, patient discharged from PT    Frequency  7X/week    PT Plan  Discharge plan needs to be updated    Co-evaluation             End of Session   Activity Tolerance: Patient tolerated treatment well Patient left: in chair;with call bell/phone within reach     Time: 0901-0911 PT Time Calculation (min): 10 min  Charges:  $Gait Training: 8-22 mins                    G CodesGustavus Bryant, [place]  [phone] 05/14/2014, 10:36 AM

## 2014-06-14 ENCOUNTER — Encounter: Payer: BC Managed Care – PPO | Admitting: Neurology

## 2014-08-27 ENCOUNTER — Other Ambulatory Visit (HOSPITAL_COMMUNITY): Payer: Self-pay | Admitting: Orthopaedic Surgery

## 2014-09-07 NOTE — Patient Instructions (Addendum)
Sharon Riddle  09/07/2014   Your procedure is scheduled on: 09/17/14   Report to [facility] Main  Entrance and follow signs to               [place] at 7:45 AM.   Call this number if you have problems the morning of surgery [phone]   Remember:  Do not eat food or drink liquids :After Midnight.     Take these medicines the morning of surgery with A SIP OF WATER: NONE                               You may not have any metal on your body including hair pins and              piercings  Do not wear jewelry, make-up, lotions, powders or perfumes.             Do not wear nail polish.  Do not shave  48 hours prior to surgery.              Men may shave face and neck.   Do not bring valuables to the hospital. [place].  Contacts, dentures or bridgework may not be worn into surgery.  Leave suitcase in the car. After surgery it may be brought to your room.     Patients discharged the day of surgery will not be allowed to drive home.  Name and phone number of your driver:  Special Instructions: N/A              Please read over the following fact sheets you were given: _____________________________________________________________________                                                     [place]  Before surgery, you can play an important role.  Because skin is not sterile, your skin needs to be as free of germs as possible.  You can reduce the number of germs on your skin by washing with CHG (chlorahexidine gluconate) soap before surgery.  CHG is an antiseptic cleaner which kills germs and bonds with the skin to continue killing germs even after washing. Please DO NOT use if you have an allergy to CHG or antibacterial soaps.  If your skin becomes reddened/irritated stop using the CHG and inform your nurse when you arrive at Short Stay. Do not shave (including legs and  underarms) for at least 48 hours prior to the first CHG shower.  You may shave your face. Please follow these instructions carefully:   1.  Shower with CHG Soap the night before surgery and the  morning of Surgery.   2.  If you choose to wash your hair, wash your hair first as usual with your  normal  Shampoo.   3.  After you shampoo, rinse your hair and body thoroughly to remove the  shampoo.  4.  Use CHG as you would any other liquid soap.  You can apply chg directly  to the skin and wash . Gently wash with scrungie or clean wascloth    5.  Apply the CHG Soap to your body ONLY FROM THE NECK DOWN.   Do not use on open                           Wound or open sores. Avoid contact with eyes, ears mouth and genitals (private parts).                        Genitals (private parts) with your normal soap.              6.  Wash thoroughly, paying special attention to the area where your surgery  will be performed.   7.  Thoroughly rinse your body with warm water from the neck down.   8.  DO NOT shower/wash with your normal soap after using and rinsing off  the CHG Soap .                9.  Pat yourself dry with a clean towel.             10.  Wear clean pajamas.             11.  Place clean sheets on your bed the night of your first shower and do not  sleep with pets.  Day of Surgery : Do not apply any lotions/deodorants the morning of surgery.  Please wear clean clothes to the hospital/surgery center.  FAILURE TO FOLLOW THESE INSTRUCTIONS MAY RESULT IN THE CANCELLATION OF YOUR SURGERY    PATIENT SIGNATURE_________________________________  ______________________________________________________________________

## 2014-09-09 ENCOUNTER — Encounter (HOSPITAL_COMMUNITY)
Admission: RE | Admit: 2014-09-09 | Discharge: 2014-09-09 | Disposition: A | Discharge status: Home / Self Care | Payer: BC Managed Care – PPO | Source: Ambulatory Visit | Attending: Orthopaedic Surgery | Admitting: Orthopaedic Surgery

## 2014-09-09 ENCOUNTER — Encounter (HOSPITAL_COMMUNITY): Payer: Self-pay

## 2014-09-09 DIAGNOSIS — M1611 Unilateral primary osteoarthritis, right hip: Secondary | ICD-10-CM | POA: Diagnosis not present

## 2014-09-09 DIAGNOSIS — Z01818 Encounter for other preprocedural examination: Secondary | ICD-10-CM | POA: Insufficient documentation

## 2014-09-09 LAB — BASIC METABOLIC PANEL WITH GFR
Anion gap: 9 (ref 5–15)
BUN: 14 mg/dL (ref 6–23)
CO2: 29 mmol/L (ref 19–32)
Calcium: 10 mg/dL (ref 8.4–10.5)
Chloride: 97 mmol/L (ref 96–112)
Creatinine, Ser: 0.55 mg/dL (ref 0.50–1.10)
GFR calc Af Amer: >90 mL/min
GFR calc non Af Amer: >90 mL/min
Glucose, Bld: 96 mg/dL (ref 70–99)
Potassium: 4.4 mmol/L (ref 3.5–5.1)
Sodium: 135 mmol/L (ref 135–145)

## 2014-09-09 LAB — SURGICAL PCR SCREEN
MRSA, PCR: NEGATIVE
Staphylococcus aureus: NEGATIVE

## 2014-09-09 LAB — CBC
HEMATOCRIT: 42.8 % (ref 36.0–46.0)
Hemoglobin: 14.2 g/dL (ref 12.0–15.0)
MCH: 28.6 pg (ref 26.0–34.0)
MCHC: 33.2 g/dL (ref 30.0–36.0)
MCV: 86.3 fL (ref 78.0–100.0)
PLATELETS: 274 10*3/uL (ref 150–400)
RBC: 4.96 MIL/uL (ref 3.87–5.11)
RDW: 15.2 % (ref 11.5–15.5)
WBC: 4.4 10*3/uL (ref 4.0–10.5)

## 2014-09-09 LAB — APTT: aPTT: 31 seconds (ref 24–37)

## 2014-09-09 LAB — PROTIME-INR
INR: 1.07 (ref 0.00–1.49)
Prothrombin Time: 14.1 s (ref 11.6–15.2)

## 2014-09-09 LAB — ABO/RH: ABO/RH(D): O POS

## 2014-09-17 ENCOUNTER — Encounter (HOSPITAL_COMMUNITY)
Admission: RE | Disposition: A | Discharge status: Home Health | Payer: Self-pay | Source: Ambulatory Visit | Attending: Orthopaedic Surgery

## 2014-09-17 ENCOUNTER — Observation Stay (HOSPITAL_COMMUNITY): Payer: BC Managed Care – PPO

## 2014-09-17 ENCOUNTER — Inpatient Hospital Stay (HOSPITAL_COMMUNITY)
Admission: RE | Admit: 2014-09-17 | Discharge: 2014-09-18 | DRG: 470 | Disposition: A | Discharge status: Home Health | Payer: BC Managed Care – PPO | Source: Ambulatory Visit | Attending: Orthopaedic Surgery | Admitting: Orthopaedic Surgery

## 2014-09-17 ENCOUNTER — Inpatient Hospital Stay (HOSPITAL_COMMUNITY): Payer: BC Managed Care – PPO

## 2014-09-17 ENCOUNTER — Encounter (HOSPITAL_COMMUNITY): Payer: Self-pay

## 2014-09-17 ENCOUNTER — Inpatient Hospital Stay (HOSPITAL_COMMUNITY): Payer: BC Managed Care – PPO | Admitting: Registered Nurse

## 2014-09-17 DIAGNOSIS — M1611 Unilateral primary osteoarthritis, right hip: Secondary | ICD-10-CM | POA: Diagnosis present

## 2014-09-17 DIAGNOSIS — Z96642 Presence of left artificial hip joint: Secondary | ICD-10-CM | POA: Diagnosis present

## 2014-09-17 DIAGNOSIS — I10 Essential (primary) hypertension: Secondary | ICD-10-CM | POA: Diagnosis present

## 2014-09-17 DIAGNOSIS — Z96649 Presence of unspecified artificial hip joint: Secondary | ICD-10-CM

## 2014-09-17 DIAGNOSIS — Q760 Spina bifida occulta: Secondary | ICD-10-CM | POA: Diagnosis not present

## 2014-09-17 DIAGNOSIS — Z01812 Encounter for preprocedural laboratory examination: Secondary | ICD-10-CM

## 2014-09-17 DIAGNOSIS — R52 Pain, unspecified: Secondary | ICD-10-CM

## 2014-09-17 DIAGNOSIS — D62 Acute posthemorrhagic anemia: Secondary | ICD-10-CM | POA: Diagnosis not present

## 2014-09-17 DIAGNOSIS — M25551 Pain in right hip: Secondary | ICD-10-CM | POA: Diagnosis present

## 2014-09-17 DIAGNOSIS — Z96643 Presence of artificial hip joint, bilateral: Secondary | ICD-10-CM | POA: Insufficient documentation

## 2014-09-17 DIAGNOSIS — Z96641 Presence of right artificial hip joint: Secondary | ICD-10-CM

## 2014-09-17 HISTORY — PX: TOTAL HIP ARTHROPLASTY: SHX124

## 2014-09-17 LAB — TYPE AND SCREEN
ABO/RH(D): O POS
Antibody Screen: NEGATIVE

## 2014-09-17 SURGERY — ARTHROPLASTY, HIP, TOTAL, ANTERIOR APPROACH
Anesthesia: General | Site: Hip | Laterality: Right

## 2014-09-17 MED ORDER — KETOROLAC TROMETHAMINE 30 MG/ML IJ SOLN
INTRAMUSCULAR | Status: AC
  Filled 2014-09-17: qty 1

## 2014-09-17 MED ORDER — PROPOFOL 10 MG/ML IV BOLUS
INTRAVENOUS | Status: AC
  Filled 2014-09-17: qty 20

## 2014-09-17 MED ORDER — CEFAZOLIN SODIUM-DEXTROSE 2-3 GM-% IV SOLR
2.0000 g | INTRAVENOUS | Status: AC
  Administered 2014-09-17: 2 g via INTRAVENOUS

## 2014-09-17 MED ORDER — PHENOL 1.4 % MT LIQD
1.0000 | OROMUCOSAL | Status: DC | PRN
  Filled 2014-09-17: qty 177

## 2014-09-17 MED ORDER — ONDANSETRON HCL 4 MG PO TABS: 4.0000 mg | ORAL_TABLET | Freq: Four times a day (QID) | ORAL | Status: DC | PRN

## 2014-09-17 MED ORDER — ACETAMINOPHEN 10 MG/ML IV SOLN
1000.0000 mg | Freq: Once | INTRAVENOUS | Status: AC
  Administered 2014-09-17: 1000 mg via INTRAVENOUS
  Filled 2014-09-17: qty 100

## 2014-09-17 MED ORDER — LACTATED RINGERS IV SOLN
INTRAVENOUS | Status: DC
  Administered 2014-09-17: 11:00:00 via INTRAVENOUS
  Administered 2014-09-17: 1000 mL via INTRAVENOUS

## 2014-09-17 MED ORDER — LABETALOL HCL 5 MG/ML IV SOLN
INTRAVENOUS | Status: AC
  Filled 2014-09-17: qty 4

## 2014-09-17 MED ORDER — NEOSTIGMINE METHYLSULFATE 10 MG/10ML IV SOLN
INTRAVENOUS | Status: AC
  Filled 2014-09-17: qty 1

## 2014-09-17 MED ORDER — CEFAZOLIN SODIUM 1-5 GM-% IV SOLN
1.0000 g | Freq: Four times a day (QID) | INTRAVENOUS | Status: AC
  Administered 2014-09-17 (×2): 1 g via INTRAVENOUS
  Filled 2014-09-17 (×2): qty 50

## 2014-09-17 MED ORDER — DIPHENHYDRAMINE HCL 12.5 MG/5ML PO ELIX: 12.5000 mg | ORAL_SOLUTION | ORAL | Status: DC | PRN

## 2014-09-17 MED ORDER — LOSARTAN POTASSIUM-HCTZ 50-12.5 MG PO TABS: 1.0000 | ORAL_TABLET | Freq: Every morning | ORAL | Status: DC

## 2014-09-17 MED ORDER — FENTANYL CITRATE 0.05 MG/ML IJ SOLN
INTRAMUSCULAR | Status: DC | PRN
  Administered 2014-09-17 (×4): 50 ug via INTRAVENOUS

## 2014-09-17 MED ORDER — SODIUM CHLORIDE 0.9 % IV SOLN
1000.0000 mg | INTRAVENOUS | Status: AC
  Administered 2014-09-17: 1000 mg via INTRAVENOUS
  Filled 2014-09-17: qty 10

## 2014-09-17 MED ORDER — METOCLOPRAMIDE HCL 5 MG/ML IJ SOLN: 5.0000 mg | Freq: Three times a day (TID) | INTRAMUSCULAR | Status: DC | PRN

## 2014-09-17 MED ORDER — FERROUS SULFATE 325 (65 FE) MG PO TABS
325.0000 mg | ORAL_TABLET | Freq: Three times a day (TID) | ORAL | Status: DC
Start: 2014-09-18 — End: 2014-09-18
  Filled 2014-09-17 (×4): qty 1

## 2014-09-17 MED ORDER — LABETALOL HCL 5 MG/ML IV SOLN
INTRAVENOUS | Status: DC | PRN
  Administered 2014-09-17: 2.5 mg via INTRAVENOUS

## 2014-09-17 MED ORDER — KETOROLAC TROMETHAMINE 15 MG/ML IJ SOLN
15.0000 mg | Freq: Four times a day (QID) | INTRAMUSCULAR | Status: DC | PRN
  Administered 2014-09-17 – 2014-09-18 (×3): 15 mg via INTRAVENOUS
  Filled 2014-09-17 (×2): qty 1

## 2014-09-17 MED ORDER — FENTANYL CITRATE 0.05 MG/ML IJ SOLN
INTRAMUSCULAR | Status: AC
  Filled 2014-09-17: qty 2

## 2014-09-17 MED ORDER — HYDROMORPHONE HCL 1 MG/ML IJ SOLN: 0.2500 mg | INTRAMUSCULAR | Status: DC | PRN

## 2014-09-17 MED ORDER — KETOROLAC TROMETHAMINE 15 MG/ML IJ SOLN
INTRAMUSCULAR | Status: AC
  Filled 2014-09-17: qty 1

## 2014-09-17 MED ORDER — ONDANSETRON HCL 4 MG/2ML IJ SOLN: 4.0000 mg | Freq: Four times a day (QID) | INTRAMUSCULAR | Status: DC | PRN

## 2014-09-17 MED ORDER — ALUM & MAG HYDROXIDE-SIMETH 200-200-20 MG/5ML PO SUSP: 30.0000 mL | ORAL | Status: DC | PRN

## 2014-09-17 MED ORDER — ZOLPIDEM TARTRATE 5 MG PO TABS: 5.0000 mg | ORAL_TABLET | Freq: Every evening | ORAL | Status: DC | PRN

## 2014-09-17 MED ORDER — ATROPINE SULFATE 0.4 MG/ML IJ SOLN
INTRAMUSCULAR | Status: DC | PRN
  Administered 2014-09-17: .6 mg via INTRAVENOUS

## 2014-09-17 MED ORDER — HYDROMORPHONE HCL 2 MG PO TABS
2.0000 mg | ORAL_TABLET | ORAL | Status: DC | PRN
Start: 2014-09-17 — End: 2014-09-18

## 2014-09-17 MED ORDER — SODIUM CHLORIDE 0.9 % IR SOLN
Status: DC | PRN
  Administered 2014-09-17 (×2): 1000 mL

## 2014-09-17 MED ORDER — ROCURONIUM BROMIDE 100 MG/10ML IV SOLN
INTRAVENOUS | Status: AC
  Filled 2014-09-17: qty 1

## 2014-09-17 MED ORDER — DEXAMETHASONE SODIUM PHOSPHATE 10 MG/ML IJ SOLN
INTRAMUSCULAR | Status: DC | PRN
  Administered 2014-09-17: 10 mg via INTRAVENOUS

## 2014-09-17 MED ORDER — HYDROMORPHONE HCL 2 MG/ML IJ SOLN
INTRAMUSCULAR | Status: AC
Start: 2014-09-17 — End: 2014-09-17
  Filled 2014-09-17: qty 1

## 2014-09-17 MED ORDER — SODIUM CHLORIDE 0.9 % IV SOLN
INTRAVENOUS | Status: DC
  Administered 2014-09-17: 14:00:00 via INTRAVENOUS

## 2014-09-17 MED ORDER — ATROPINE SULFATE 0.4 MG/ML IJ SOLN
INTRAMUSCULAR | Status: AC
  Filled 2014-09-17: qty 2

## 2014-09-17 MED ORDER — MIDAZOLAM HCL 2 MG/2ML IJ SOLN
INTRAMUSCULAR | Status: AC
  Filled 2014-09-17: qty 2

## 2014-09-17 MED ORDER — LOSARTAN POTASSIUM 50 MG PO TABS
50.0000 mg | ORAL_TABLET | Freq: Every day | ORAL | Status: DC
  Filled 2014-09-17 (×2): qty 1

## 2014-09-17 MED ORDER — LIDOCAINE HCL (CARDIAC) 20 MG/ML IV SOLN
INTRAVENOUS | Status: DC | PRN
  Administered 2014-09-17: 80 mg via INTRAVENOUS

## 2014-09-17 MED ORDER — DEXAMETHASONE SODIUM PHOSPHATE 10 MG/ML IJ SOLN
INTRAMUSCULAR | Status: AC
  Filled 2014-09-17: qty 1

## 2014-09-17 MED ORDER — MENTHOL 3 MG MT LOZG
1.0000 | LOZENGE | OROMUCOSAL | Status: DC | PRN
  Filled 2014-09-17: qty 9

## 2014-09-17 MED ORDER — LIDOCAINE HCL (CARDIAC) 20 MG/ML IV SOLN
INTRAVENOUS | Status: AC
  Filled 2014-09-17: qty 5

## 2014-09-17 MED ORDER — METHOCARBAMOL 500 MG PO TABS: 500.0000 mg | ORAL_TABLET | Freq: Four times a day (QID) | ORAL | Status: DC | PRN

## 2014-09-17 MED ORDER — CALCIUM CARBONATE ANTACID 500 MG PO CHEW
1.0000 | CHEWABLE_TABLET | Freq: Two times a day (BID) | ORAL | Status: DC
  Administered 2014-09-17: 200 mg via ORAL
  Filled 2014-09-17 (×3): qty 1

## 2014-09-17 MED ORDER — BUPIVACAINE HCL (PF) 0.25 % IJ SOLN
INTRAMUSCULAR | Status: DC | PRN
  Administered 2014-09-17: 10 mL

## 2014-09-17 MED ORDER — NEOSTIGMINE METHYLSULFATE 10 MG/10ML IV SOLN
INTRAVENOUS | Status: DC | PRN
  Administered 2014-09-17: 3 mg via INTRAVENOUS

## 2014-09-17 MED ORDER — MIDAZOLAM HCL 5 MG/5ML IJ SOLN
INTRAMUSCULAR | Status: DC | PRN
  Administered 2014-09-17: 2 mg via INTRAVENOUS

## 2014-09-17 MED ORDER — ONDANSETRON HCL 4 MG/2ML IJ SOLN
INTRAMUSCULAR | Status: AC
  Filled 2014-09-17: qty 2

## 2014-09-17 MED ORDER — DOCUSATE SODIUM 100 MG PO CAPS
100.0000 mg | ORAL_CAPSULE | Freq: Two times a day (BID) | ORAL | Status: DC
  Administered 2014-09-17: 100 mg via ORAL

## 2014-09-17 MED ORDER — KETOROLAC TROMETHAMINE 15 MG/ML IJ SOLN: 15.0000 mg | Freq: Four times a day (QID) | INTRAMUSCULAR | Status: DC | PRN

## 2014-09-17 MED ORDER — ACETAMINOPHEN 650 MG RE SUPP: 650.0000 mg | Freq: Four times a day (QID) | RECTAL | Status: DC | PRN

## 2014-09-17 MED ORDER — METOCLOPRAMIDE HCL 10 MG PO TABS
5.0000 mg | ORAL_TABLET | Freq: Three times a day (TID) | ORAL | Status: DC | PRN
Start: 2014-09-17 — End: 2014-09-18

## 2014-09-17 MED ORDER — HYDROMORPHONE HCL 1 MG/ML IJ SOLN: 1.0000 mg | INTRAMUSCULAR | Status: DC | PRN

## 2014-09-17 MED ORDER — KETOROLAC TROMETHAMINE 15 MG/ML IJ SOLN
15.0000 mg | Freq: Once | INTRAMUSCULAR | Status: AC
  Administered 2014-09-17: 15 mg via INTRAVENOUS

## 2014-09-17 MED ORDER — STERILE WATER FOR IRRIGATION IR SOLN
Status: DC | PRN
Start: 2014-09-17 — End: 2014-09-17
  Administered 2014-09-17: 1500 mL

## 2014-09-17 MED ORDER — SODIUM CHLORIDE 0.9 % IJ SOLN
INTRAMUSCULAR | Status: AC
  Filled 2014-09-17: qty 50

## 2014-09-17 MED ORDER — ACETAMINOPHEN 500 MG PO TABS
500.0000 mg | ORAL_TABLET | Freq: Four times a day (QID) | ORAL | Status: DC | PRN
  Administered 2014-09-17 – 2014-09-18 (×3): 1000 mg via ORAL
  Filled 2014-09-17 (×3): qty 2

## 2014-09-17 MED ORDER — ACETAMINOPHEN 325 MG PO TABS: 650.0000 mg | ORAL_TABLET | Freq: Four times a day (QID) | ORAL | Status: DC | PRN

## 2014-09-17 MED ORDER — ROCURONIUM BROMIDE 100 MG/10ML IV SOLN
INTRAVENOUS | Status: DC | PRN
Start: 2014-09-17 — End: 2014-09-17
  Administered 2014-09-17: 40 mg via INTRAVENOUS

## 2014-09-17 MED ORDER — GLYCOPYRROLATE 0.2 MG/ML IJ SOLN
INTRAMUSCULAR | Status: AC
  Filled 2014-09-17: qty 2

## 2014-09-17 MED ORDER — PROPOFOL 10 MG/ML IV BOLUS
INTRAVENOUS | Status: DC | PRN
  Administered 2014-09-17: 16 mg via INTRAVENOUS

## 2014-09-17 MED ORDER — HYDROCHLOROTHIAZIDE 12.5 MG PO CAPS
12.5000 mg | ORAL_CAPSULE | Freq: Every day | ORAL | Status: DC
  Filled 2014-09-17: qty 1

## 2014-09-17 MED ORDER — RIVAROXABAN 10 MG PO TABS
10.0000 mg | ORAL_TABLET | Freq: Every day | ORAL | Status: DC
  Administered 2014-09-18: 10 mg via ORAL
  Filled 2014-09-17 (×2): qty 1

## 2014-09-17 MED ORDER — DEXTROSE 5 % IV SOLN
500.0000 mg | Freq: Four times a day (QID) | INTRAVENOUS | Status: DC | PRN
  Filled 2014-09-17: qty 5

## 2014-09-17 MED ORDER — CEFAZOLIN SODIUM-DEXTROSE 2-3 GM-% IV SOLR
INTRAVENOUS | Status: AC
  Filled 2014-09-17: qty 50

## 2014-09-17 MED ORDER — HYDROMORPHONE HCL 1 MG/ML IJ SOLN
INTRAMUSCULAR | Status: DC | PRN
  Administered 2014-09-17 (×3): 0.5 mg via INTRAVENOUS

## 2014-09-17 MED ORDER — SODIUM CHLORIDE 0.9 % IJ SOLN
INTRAMUSCULAR | Status: DC | PRN
  Administered 2014-09-17: 40 mL

## 2014-09-17 MED ORDER — GLYCOPYRROLATE 0.2 MG/ML IJ SOLN
INTRAMUSCULAR | Status: DC | PRN
  Administered 2014-09-17: 0.4 mg via INTRAVENOUS
  Administered 2014-09-17: 0.2 mg via INTRAVENOUS

## 2014-09-17 MED ORDER — ONDANSETRON HCL 4 MG/2ML IJ SOLN
INTRAMUSCULAR | Status: DC | PRN
  Administered 2014-09-17: 4 mg via INTRAVENOUS

## 2014-09-17 MED ORDER — BUPIVACAINE HCL (PF) 0.25 % IJ SOLN
INTRAMUSCULAR | Status: AC
  Filled 2014-09-17: qty 30

## 2014-09-17 MED ORDER — BUPIVACAINE LIPOSOME 1.3 % IJ SUSP
20.0000 mL | Freq: Once | INTRAMUSCULAR | Status: AC
  Administered 2014-09-17: 20 mL
  Filled 2014-09-17: qty 20

## 2014-09-17 MED ORDER — PROMETHAZINE HCL 25 MG/ML IJ SOLN: 6.2500 mg | INTRAMUSCULAR | Status: DC | PRN

## 2014-09-17 SURGICAL SUPPLY — 45 items
APL SKNCLS STERI-STRIP NONHPOA (GAUZE/BANDAGES/DRESSINGS) ×1
BAG DECANTER FOR FLEXI CONT (MISCELLANEOUS) ×3 IMPLANT
BAG SPEC THK2 15X12 ZIP CLS (MISCELLANEOUS)
BAG ZIPLOCK 12X15 (MISCELLANEOUS) IMPLANT
BENZOIN TINCTURE PRP APPL 2/3 (GAUZE/BANDAGES/DRESSINGS) ×2 IMPLANT
BLADE SAW SGTL 18X1.27X75 (BLADE) ×2 IMPLANT
BLADE SAW SGTL 18X1.27X75MM (BLADE) ×1
CAPT HIP TOTAL 2 ×2 IMPLANT
CELLS DAT CNTRL 66122 CELL SVR (MISCELLANEOUS) ×1 IMPLANT
CLOSURE WOUND 1/2 X4 (GAUZE/BANDAGES/DRESSINGS) ×1
COVER PERINEAL POST (MISCELLANEOUS) ×3 IMPLANT
DRAPE C-ARM 42X120 X-RAY (DRAPES) ×3 IMPLANT
DRAPE STERI IOBAN 125X83 (DRAPES) ×3 IMPLANT
DRAPE U-SHAPE 47X51 STRL (DRAPES) ×9 IMPLANT
DRSG AQUACEL AG ADV 3.5X10 (GAUZE/BANDAGES/DRESSINGS) ×3 IMPLANT
DURAPREP 26ML APPLICATOR (WOUND CARE) ×3 IMPLANT
ELECT BLADE TIP CTD 4 INCH (ELECTRODE) ×3 IMPLANT
ELECT REM PT RETURN 9FT ADLT (ELECTROSURGICAL) ×3
ELECTRODE REM PT RTRN 9FT ADLT (ELECTROSURGICAL) ×1 IMPLANT
FACESHIELD WRAPAROUND (MASK) ×12 IMPLANT
FACESHIELD WRAPAROUND OR TEAM (MASK) ×4 IMPLANT
GAUZE XEROFORM 1X8 LF (GAUZE/BANDAGES/DRESSINGS) IMPLANT
GLOVE BIO SURGEON STRL SZ7.5 (GLOVE) ×3 IMPLANT
GLOVE BIOGEL PI IND STRL 8 (GLOVE) ×2 IMPLANT
GLOVE BIOGEL PI INDICATOR 8 (GLOVE) ×4
GLOVE ECLIPSE 8.0 STRL XLNG CF (GLOVE) ×3 IMPLANT
GOWN STRL REUS W/TWL XL LVL3 (GOWN DISPOSABLE) ×6 IMPLANT
HANDPIECE INTERPULSE COAX TIP (DISPOSABLE) ×3
KIT BASIN OR (CUSTOM PROCEDURE TRAY) ×3 IMPLANT
PACK TOTAL JOINT (CUSTOM PROCEDURE TRAY) ×3 IMPLANT
PEN SKIN MARKING BROAD (MISCELLANEOUS) ×3 IMPLANT
RETRACTOR WND ALEXIS 18 MED (MISCELLANEOUS) ×1 IMPLANT
RTRCTR WOUND ALEXIS 18CM MED (MISCELLANEOUS) ×3
SET HNDPC FAN SPRY TIP SCT (DISPOSABLE) ×1 IMPLANT
STAPLER VISISTAT 35W (STAPLE) IMPLANT
STRIP CLOSURE SKIN 1/2X4 (GAUZE/BANDAGES/DRESSINGS) ×1 IMPLANT
SUT ETHIBOND NAB CT1 #1 30IN (SUTURE) ×3 IMPLANT
SUT MNCRL AB 4-0 PS2 18 (SUTURE) IMPLANT
SUT VIC AB 0 CT1 36 (SUTURE) ×3 IMPLANT
SUT VIC AB 1 CT1 36 (SUTURE) ×3 IMPLANT
SUT VIC AB 2-0 CT1 27 (SUTURE) ×6
SUT VIC AB 2-0 CT1 TAPERPNT 27 (SUTURE) ×2 IMPLANT
TOWEL OR 17X26 10 PK STRL BLUE (TOWEL DISPOSABLE) ×3 IMPLANT
TOWEL OR NON WOVEN STRL DISP B (DISPOSABLE) ×3 IMPLANT
TRAY FOLEY CATH 14FRSI W/METER (CATHETERS) ×3 IMPLANT

## 2014-09-17 NOTE — Anesthesia Postprocedure Evaluation (Signed)
  Anesthesia Post-op Note  Patient: Sharon Riddle  Procedure(s) Performed: Procedure(s) (LRB): RIGHT TOTAL HIP ARTHROPLASTY ANTERIOR APPROACH (Right)  Patient Location: PACU  Anesthesia Type: General  Level of Consciousness: awake and alert   Airway and Oxygen Therapy: Patient Spontanous Breathing  Post-op Pain: mild  Post-op Assessment: Post-op Vital signs reviewed, Patient's Cardiovascular Status Stable, Respiratory Function Stable, Patent Airway and No signs of Nausea or vomiting  Last Vitals:  Filed Vitals:   09/17/14 1511  BP: 138/94  Pulse: 87  Temp: 37 C  Resp: 18    Post-op Vital Signs: stable   Complications: No apparent anesthesia complications

## 2014-09-17 NOTE — Anesthesia Preprocedure Evaluation (Addendum)
Anesthesia Evaluation  Patient identified by MRN, date of birth, ID band Patient awake    Reviewed: Allergy & Precautions, NPO status , Patient's Chart, lab work & pertinent test results  History of Anesthesia Complications (+) PONV and history of anesthetic complications  Airway Mallampati: II  TM Distance: >3 FB Neck ROM: Full    Dental no notable dental hx.    Pulmonary neg pulmonary ROS,  breath sounds clear to auscultation  Pulmonary exam normal       Cardiovascular Exercise Tolerance: Good hypertension, Pt. on medications Rhythm:Regular Rate:Normal     Neuro/Psych  Headaches, negative psych ROS   GI/Hepatic negative GI ROS, Neg liver ROS,   Endo/Other  negative endocrine ROS  Renal/GU negative Renal ROS  negative genitourinary   Musculoskeletal  (+) Arthritis -,   Abdominal   Peds negative pediatric ROS (+)  Hematology negative hematology ROS (+)   Anesthesia Other Findings   Reproductive/Obstetrics negative OB ROS                         Anesthesia Physical Anesthesia Plan  ASA: II  Anesthesia Plan: General   Post-op Pain Management:    Induction: Intravenous  Airway Management Planned: Oral ETT  Additional Equipment:   Intra-op Plan:   Post-operative Plan: Extubation in OR  Informed Consent: I have reviewed the patients History and Physical, chart, labs and discussed the procedure including the risks, benefits and alternatives for the proposed anesthesia with the patient or authorized representative who has indicated his/her understanding and acceptance.   Dental advisory given  Plan Discussed with: CRNA  Anesthesia Plan Comments: (Discussed spinal and general. She has spina bifida occulta and had a difficult experience with an epidural in the past. She prefers general and did fine with general for other hip last year.)       Anesthesia Quick Evaluation

## 2014-09-17 NOTE — Brief Op Note (Signed)
09/17/2014  11:12 AM  PATIENT:  Terri Piedra  54 y.o. female  PRE-OPERATIVE DIAGNOSIS:  Right hip osteoarthritis  POST-OPERATIVE DIAGNOSIS:  Right hip osteoarthritis  PROCEDURE:  Procedure(s): RIGHT TOTAL HIP ARTHROPLASTY ANTERIOR APPROACH (Right)  SURGEON:  Surgeon(s) and Role:    * Mcarthur Rossetti, MD - Primary  PHYSICIAN ASSISTANT: Benita Stabile, PA-C  ANESTHESIA:   local and general  EBL:  Total I/O In: 1000 [I.V.:1000] Out: 550 [Blood:550]  BLOOD ADMINISTERED:none  DRAINS: none   LOCAL MEDICATIONS USED:  MARCAINE    and OTHER Experil  SPECIMEN:  No Specimen  DISPOSITION OF SPECIMEN:  N/A  COUNTS:  YES  TOURNIQUET:  * No tourniquets in log *  DICTATION: .Other Dictation: Dictation Number [id number]  PLAN OF CARE: Admit to inpatient   PATIENT DISPOSITION:  PACU - hemodynamically stable.   Delay start of Pharmacological VTE agent (>24hrs) due to surgical blood loss or risk of bleeding: no

## 2014-09-17 NOTE — Plan of Care (Signed)
Problem: Consults Goal: Diagnosis- Total Joint Replacement Primary Total Hip     

## 2014-09-17 NOTE — Anesthesia Procedure Notes (Signed)
Procedure Name: Intubation Date/Time: 09/17/2014 9:54 AM Performed by: Carleene Cooper A Pre-anesthesia Checklist: Patient identified, Timeout performed, Emergency Drugs available, Suction available and Patient being monitored Patient Re-evaluated:Patient Re-evaluated prior to inductionOxygen Delivery Method: Circle system utilized Preoxygenation: Pre-oxygenation with 100% oxygen Intubation Type: IV induction Ventilation: Mask ventilation without difficulty Laryngoscope Size: Mac and 4 Grade View: Grade I Tube type: Oral Tube size: 7.5 mm Number of attempts: 1 Airway Equipment and Method: Stylet Secured at: 19 cm Tube secured with: Tape Dental Injury: Teeth and Oropharynx as per pre-operative assessment  Comments: Intubated with ease by Dr. Delma Post.

## 2014-09-17 NOTE — Evaluation (Signed)
Physical Therapy Evaluation Patient Details Name: Sharon Riddle MRN: [id number] DOB: 09-Jun-1961 Today's Date: 09/17/2014   History of Present Illness  R THR - ant; L THR - post 10/15  Clinical Impression  Pt s/p R THR presents with decreased R LE strength/ROM and post op pain limiting functional mobility.  Pt should progress well to d.c home with family assist and HHPT follow up.    Follow Up Recommendations Home health PT    Equipment Recommendations  None recommended by PT    Recommendations for Other Services OT consult     Precautions / Restrictions Precautions Precautions: Fall Restrictions Weight Bearing Restrictions: No Other Position/Activity Restrictions: WBAT      Mobility  Bed Mobility Overal bed mobility: Needs Assistance Bed Mobility: Supine to Sit     Supine to sit: Supervision     General bed mobility comments: min cues for sequence and use of L LE to self assist  Transfers Overall transfer level: Needs assistance Equipment used: Rolling walker (2 wheeled) Transfers: Sit to/from Stand Sit to Stand: Supervision         General transfer comment: min cues for use of UEs to self assist  Ambulation/Gait Ambulation/Gait assistance: Min guard Ambulation Distance (Feet): 200 Feet Assistive device: Rolling walker (2 wheeled) Gait Pattern/deviations: Step-through pattern;Decreased step length - right;Decreased step length - left;Shuffle;Trunk flexed Gait velocity: mod pace   General Gait Details: cues for posture, ER on R, position from RW and initial sequence  Stairs            Wheelchair Mobility    Modified Rankin (Stroke Patients Only)       Balance                                             Pertinent Vitals/Pain Pain Assessment: 0-10 Pain Score: 3  Pain Location: R hip Pain Descriptors / Indicators: Aching;Sore Pain Intervention(s): Limited activity within patient's tolerance;Monitored during  session;Premedicated before session;Ice applied    Home Living Family/patient expects to be discharged to:: Private residence Living Arrangements: Spouse/significant other Available Help at Discharge: Family;Available 24 hours/day Type of Home: House Home Access: Stairs to enter Entrance Stairs-Rails: None Entrance Stairs-Number of Steps: 2 Home Layout: One level Home Equipment: Shower seat - built in;Walker - 2 wheels;Cane - single point      Prior Function Level of Independence: Independent         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        Extremity/Trunk Assessment   Upper Extremity Assessment: Overall WFL for tasks assessed           Lower Extremity Assessment: RLE deficits/detail RLE Deficits / Details: 3/5 hip strength with AAROM at hip to 100 flex and 20 abd    Cervical / Trunk Assessment: Normal  Communication   Communication: No difficulties  Cognition Arousal/Alertness: Awake/alert Behavior During Therapy: WFL for tasks assessed/performed Overall Cognitive Status: Within Functional Limits for tasks assessed                      General Comments      Exercises Total Joint Exercises Ankle Circles/Pumps: AROM;Both;15 reps;Supine Quad Sets: AROM;Both;10 reps;Supine Heel Slides: AAROM;Right;15 reps;Supine Hip ABduction/ADduction: AAROM;Right;10 reps;Supine      Assessment/Plan    PT Assessment Patient needs continued PT services  PT Diagnosis Difficulty walking   PT Problem List Decreased strength;Decreased range of motion;Decreased activity tolerance;Decreased mobility;Decreased knowledge of use of DME;Pain  PT Treatment Interventions DME instruction;Gait training;Stair training;Functional mobility training;Therapeutic activities;Therapeutic exercise;Patient/family education   PT Goals (Current goals can be found in the Care Plan section) Acute Rehab PT Goals Patient Stated Goal: Resume previous lifestyle with  decreased pain PT Goal Formulation: With patient Time For Goal Achievement: 09/21/14 Potential to Achieve Goals: Good    Frequency 7X/week   Barriers to discharge        Co-evaluation               End of Session Equipment Utilized During Treatment: Gait belt Activity Tolerance: Patient tolerated treatment well Patient left: in chair;with call bell/phone within reach;with family/visitor present Nurse Communication: Mobility status         Time: 0881-1031 PT Time Calculation (min) (ACUTE ONLY): 33 min   Charges:   PT Evaluation $Initial PT Evaluation Tier I: 1 Procedure PT Treatments $Gait Training: 8-22 mins   PT G Codes:        Cherre Kothari 10-08-2014, 4:56 PM

## 2014-09-17 NOTE — Transfer of Care (Signed)
Immediate Anesthesia Transfer of Care Note  Patient: Sharon Riddle  Procedure(s) Performed: Procedure(s): RIGHT TOTAL HIP ARTHROPLASTY ANTERIOR APPROACH (Right)  Patient Location: PACU  Anesthesia Type:General  Level of Consciousness: awake, alert , oriented and patient cooperative  Airway & Oxygen Therapy: Patient Spontanous Breathing and Patient connected to face mask oxygen  Post-op Assessment: Report given to RN, Post -op Vital signs reviewed and stable and Patient moving all extremities X 4  Post vital signs: Reviewed and stable  Last Vitals:  Filed Vitals:   09/17/14 0737  BP: 139/83  Pulse: 58  Temp: 36.4 C  Resp: 18    Complications: No apparent anesthesia complications

## 2014-09-17 NOTE — H&P (Signed)
TOTAL HIP ADMISSION H&P  Patient is admitted for right total hip arthroplasty.  Subjective:  Chief Complaint: right hip pain  HPI: Sharon Riddle, 54 y.o. female, has a history of pain and functional disability in the right hip(s) due to arthritis and patient has failed non-surgical conservative treatments for greater than 12 weeks to include NSAID's and/or analgesics, corticosteriod injections, flexibility and strengthening excercises and activity modification.  Onset of symptoms was gradual starting 3 years ago with gradually worsening course since that time.The patient noted no past surgery on the right hip(s).  Patient currently rates pain in the right hip at 10 out of 10 with activity. Patient has night pain, worsening of pain with activity and weight bearing, pain that interfers with activities of daily living and pain with passive range of motion. Patient has evidence of subchondral cysts, subchondral sclerosis, periarticular osteophytes and joint space narrowing by imaging studies. This condition presents safety issues increasing the risk of falls.  There is no current active infection.  Patient Active Problem List   Diagnosis Date Noted  . Osteoarthritis of right hip 09/17/2014  . Osteoarthritis of left hip 05/13/2014  . Hip arthritis 05/13/2014  . Spina bifida occulta 07/23/2013  . DDD (degenerative disc disease), lumbar 07/23/2013  . Elevated blood pressure reading without diagnosis of hypertension 01/01/2012  . MIGRAINE HEADACHE 03/24/2008  . VERTIGO, INTERMITTENT 03/24/2008  . LICHEN PLANUS 05/09/8526   Past Medical History  Diagnosis Date  . Arthritis   . Loose body of right knee 04/18/2012  . Hypertension     NSAID related  . Complication of anesthesia     Post anesthesia shivers  . PONV (postoperative nausea and vomiting)     N/V with pain medications  . Vertigo     hx of  . History of kidney stones   . Osteoarthritis of left hip 05/13/2014    Past Surgical History   Procedure Laterality Date  . Tonsillectomy    . Knee arthroscopy  04/18/2012    Procedure: ARTHROSCOPY KNEE;  Surgeon: Johnny Bridge, MD;  Location: [place];  Service: Orthopedics;  Laterality: Right;  Removal of Loose Bodies-2  . Total hip arthroplasty Left 05/13/2014    Procedure: LEFT TOTAL HIP ARTHROPLASTY;  Surgeon: Johnny Bridge, MD;  Location: [place];  Service: Orthopedics;  Laterality: Left;    No prescriptions prior to admission   Allergies  Allergen Reactions  . Other Nausea And Vomiting and Other (See Comments)    Pain medications and muscle relaxers, can be given with Zofran  . Tramadol Other (See Comments)    Passed out  . Nsaids     GI upset, elevates blood pressure--can take toradol   . Pennsaid [Diclofenac Sodium]     Elevates blood pressure  . Sulfonamide Derivatives     REACTION: rash    History  Substance Use Topics  . Smoking status: Never Smoker   . Smokeless tobacco: Never Used  . Alcohol Use: Yes     Comment: social, red wine 1 glass 3-4 drinks per week    Family History  Problem Relation Age of Onset  . Diabetes Mother   . Goiter Mother   . Colon polyps Father   . Addison's disease Sister   . Lymphoma Mother     Deceased, 62  . Bladder Cancer Father     Deceased 80  . Crohn's disease Son   . Healthy Daughter   . Healthy Brother  Review of Systems  Musculoskeletal: Positive for joint pain.  All other systems reviewed and are negative.   Objective:  Physical Exam  Constitutional: She is oriented to person, place, and time. She appears well-developed and well-nourished.  HENT:  Head: Normocephalic and atraumatic.  Eyes: EOM are normal. Pupils are equal, round, and reactive to light.  Neck: Normal range of motion. Neck supple.  Cardiovascular: Normal rate and regular rhythm.   Respiratory: Effort normal and breath sounds normal.  GI: Soft. Bowel sounds are normal.  Musculoskeletal:       Right hip: She exhibits  decreased range of motion, decreased strength, tenderness and bony tenderness.  Neurological: She is alert and oriented to person, place, and time.  Skin: Skin is warm and dry.  Psychiatric: She has a normal mood and affect.    Vital signs in last 24 hours:    Labs:   Estimated body mass index is 22.13 kg/(m^2) as calculated from the following:   Height as of 05/06/14: 5\' 5"  (1.651 m).   Weight as of 05/06/14: 60.328 kg (133 lb).   Imaging Review Plain radiographs demonstrate severe degenerative joint disease of the right hip(s). The bone quality appears to be excellent for age and reported activity level.  Assessment/Plan:  End stage arthritis, right hip(s)  The patient history, physical examination, clinical judgement of the provider and imaging studies are consistent with end stage degenerative joint disease of the right hip(s) and total hip arthroplasty is deemed medically necessary. The treatment options including medical management, injection therapy, arthroscopy and arthroplasty were discussed at length. The risks and benefits of total hip arthroplasty were presented and reviewed. The risks due to aseptic loosening, infection, stiffness, dislocation/subluxation,  thromboembolic complications and other imponderables were discussed.  The patient acknowledged the explanation, agreed to proceed with the plan and consent was signed. Patient is being admitted for inpatient treatment for surgery, pain control, PT, OT, prophylactic antibiotics, VTE prophylaxis, progressive ambulation and ADL's and discharge planning.The patient is planning to be discharged home with home health services

## 2014-09-18 LAB — CBC
HCT: 30.7 % — ABNORMAL LOW (ref 36.0–46.0)
HEMOGLOBIN: 10.4 g/dL — AB (ref 12.0–15.0)
MCH: 29.1 pg (ref 26.0–34.0)
MCHC: 33.9 g/dL (ref 30.0–36.0)
MCV: 85.8 fL (ref 78.0–100.0)
PLATELETS: 196 10*3/uL (ref 150–400)
RBC: 3.58 MIL/uL — ABNORMAL LOW (ref 3.87–5.11)
RDW: 15.1 % (ref 11.5–15.5)
WBC: 8.9 10*3/uL (ref 4.0–10.5)

## 2014-09-18 LAB — BASIC METABOLIC PANEL
ANION GAP: 3 — AB (ref 5–15)
BUN: 10 mg/dL (ref 6–23)
CALCIUM: 8.4 mg/dL (ref 8.4–10.5)
CHLORIDE: 103 mmol/L (ref 96–112)
CO2: 28 mmol/L (ref 19–32)
Creatinine, Ser: 0.6 mg/dL (ref 0.50–1.10)
GFR calc Af Amer: >90 mL/min (ref >90)
GFR calc non Af Amer: >90 mL/min (ref >90)
GLUCOSE: 107 mg/dL — AB (ref 70–99)
Potassium: 4.1 mmol/L (ref 3.5–5.1)
Sodium: 134 mmol/L — ABNORMAL LOW (ref 135–145)

## 2014-09-18 MED ORDER — ASPIRIN EC 325 MG PO TBEC: 325.0000 mg | DELAYED_RELEASE_TABLET | Freq: Every day | ORAL | Status: DC

## 2014-09-18 NOTE — Progress Notes (Signed)
CARE MANAGEMENT NOTE 09/18/2014  Patient:  Sharon Riddle, Sharon Riddle   Account Number:  [id number]  Date Initiated:  09/18/2014  Documentation initiated by:  [facility]  Subjective/Objective Assessment:   Status post total replacement of right hip     Action/Plan:   Anticipated DC Date:  09/18/2014   Anticipated DC Plan:  [place]  CM consult      [facility] Choice  HOME HEALTH   Choice offered to / List presented to:  C-1 Patient        [place] arranged  [place] - 11 Patient Refused      Status of service:  Completed, signed off Medicare Important Message given?  NA - LOS <3 / Initial given by admissions (If response is "NO", the following Medicare IM given date fields will be blank) Date Medicare IM given:   Medicare IM given by:   Date Additional Medicare IM given:   Additional Medicare IM given by:    Discharge Disposition:  HOME/SELF CARE  Per UR Regulation:    If discussed at Long Length of Stay Meetings, dates discussed:    Comments:  09/18/2014 0930 NCM spoke to pt and states she does not want HH PT. States she is able to do the exercises shown to her by PT. Has RW and cane at home. Husband at home to assist with her care. Pt states she did make attending aware she was refusing therapy this am. Jonnie Finner RN CCM Case Mgmt phone [phone]

## 2014-09-18 NOTE — Progress Notes (Signed)
Physical Therapy Treatment Patient Details Name: Sharon Riddle MRN: [id number] DOB: [birth year] Today's Date: 09/18/2014    History of Present Illness R THR - ant; L THR - post 10/15    PT Comments    POD # 1 eager to D/C early this am.  Pt already dressed and stated she "already did her exercises".  Assisted with amb in hallway and practiced stairs twice.  Instructed on safety with RW and use of ICE.  Pt is a Marine scientist and very knowledgeable.   Pt ready for D/C to home this morning.  Follow Up Recommendations  Home health PT     Equipment Recommendations  None recommended by PT (has all from prior surgery)    Recommendations for Other Services       Precautions / Restrictions Precautions Precautions: Fall Restrictions Weight Bearing Restrictions: No Other Position/Activity Restrictions: WBAT    Mobility  Bed Mobility               General bed mobility comments: Pt OOB in recliner fully dressed  Transfers Overall transfer level: Needs assistance Equipment used: Rolling walker (2 wheeled)   Sit to Stand: Supervision;Modified independent (Device/Increase time)         General transfer comment: good use of hands and good safety cognition  Ambulation/Gait Ambulation/Gait assistance: Supervision Ambulation Distance (Feet): 245 Feet Assistive device: Rolling walker (2 wheeled) Gait Pattern/deviations: Step-through pattern Gait velocity: WFL   General Gait Details: one VC safety with backward gait otherwise good alternating gait and good safety cognition.   Stairs Stairs: Yes Stairs assistance: Supervision Stair Management: No rails;Step to pattern;Forwards;With walker Number of Stairs: 1 General stair comments: performed twice one step forward with walker and one VC on proper sequencing/safety.  Wheelchair Mobility    Modified Rankin (Stroke Patients Only)       Balance                                    Cognition Arousal/Alertness:  Awake/alert Behavior During Therapy: WFL for tasks assessed/performed Overall Cognitive Status: Within Functional Limits for tasks assessed                      Exercises      General Comments        Pertinent Vitals/Pain Pain Assessment: 0-10 Pain Score: 3  Pain Location: R hip Pain Descriptors / Indicators: Aching;Sore Pain Intervention(s): Monitored during session;Premedicated before session;Repositioned;Ice applied    Home Living                      Prior Function            PT Goals (current goals can now be found in the care plan section) Progress towards PT goals: Progressing toward goals    Frequency  7X/week    PT Plan      Co-evaluation             End of Session Equipment Utilized During Treatment: Gait belt Activity Tolerance: Patient tolerated treatment well Patient left: in chair;with call bell/phone within reach;with family/visitor present     Time: 0714-0728 PT Time Calculation (min) (ACUTE ONLY): 14 min  Charges:  $Gait Training: 8-22 mins                    G Codes:      Sharon Riddle  PTA [employer]  Acute  Rehab Pager      [phone]

## 2014-09-18 NOTE — Progress Notes (Signed)
Discharged from floor via w/c, spouse with pt. Has all equipment at home; is refusing [facility] PT, saying she does not need it; pt is active and highly motivated. No changes in assessment. Nijah Orlich, [employer]

## 2014-09-18 NOTE — Progress Notes (Signed)
Subjective: 1 Day Post-Op Procedure(s) (LRB): RIGHT TOTAL HIP ARTHROPLASTY ANTERIOR APPROACH (Right) Patient reports pain as moderate.  Vitals stable.  Asymptomatic acute blood loss anemia.  Objective: Vital signs in last 24 hours: Temp:  [97.5 F (36.4 C)-98.8 F (37.1 C)] 98.1 F (36.7 C) (03/05 0629) Pulse Rate:  [48-87] 67 (03/05 0629) Resp:  [9-18] 16 (03/05 0629) BP: (117-155)/(64-94) 120/90 mmHg (03/05 0629) SpO2:  [96 %-100 %] 99 % (03/05 0629)  Intake/Output from previous day: 03/04 0701 - 03/05 0700 In: 3277.8 [P.O.:924; I.V.:2243.8; IV Piggyback:110] Out: 2200 [Urine:1650; Blood:550] Intake/Output this shift: Total I/O In: 924 [P.O.:924] Out: -    Recent Labs  09/18/14 0426  HGB 10.4*    Recent Labs  09/18/14 0426  WBC 8.9  RBC 3.58*  HCT 30.7*  PLT 196    Recent Labs  09/18/14 0426  NA 134*  K 4.1  CL 103  CO2 28  BUN 10  CREATININE 0.60  GLUCOSE 107*  CALCIUM 8.4   No results for input(s): LABPT, INR in the last 72 hours.  Sensation intact distally Intact pulses distally Dorsiflexion/Plantar flexion intact Incision: scant drainage No cellulitis present Compartment soft  Assessment/Plan: 1 Day Post-Op Procedure(s) (LRB): RIGHT TOTAL HIP ARTHROPLASTY ANTERIOR APPROACH (Right) Up with therapy Discharge home with home health today.  BLACKMAN,CHRISTOPHER Y 09/18/2014, 8:38 AM

## 2014-09-18 NOTE — Discharge Summary (Signed)
Patient ID: Sharon Riddle MRN: [id number] DOB/AGE: 54-28-1962 54 y.o.  Admit date: 09/17/2014 Discharge date: 09/18/2014  Admission Diagnoses:  Principal Problem:   Osteoarthritis of right hip Active Problems:   Status post total replacement of right hip   Discharge Diagnoses:  Same  Past Medical History  Diagnosis Date  . Arthritis   . Loose body of right knee 04/18/2012  . Hypertension     NSAID related  . Complication of anesthesia     Post anesthesia shivers  . PONV (postoperative nausea and vomiting)     N/V with pain medications  . Vertigo     hx of  . History of kidney stones   . Osteoarthritis of left hip 05/13/2014    Surgeries: Procedure(s): RIGHT TOTAL HIP ARTHROPLASTY ANTERIOR APPROACH on 09/17/2014   Consultants:    Discharged Condition: Improved  Hospital Course: Sharon Riddle is an 54 y.o. female who was admitted 09/17/2014 for operative treatment ofOsteoarthritis of right hip. Patient has severe unremitting pain that affects sleep, daily activities, and work/hobbies. After pre-op clearance the patient was taken to the operating room on 09/17/2014 and underwent  Procedure(s): RIGHT TOTAL HIP ARTHROPLASTY ANTERIOR APPROACH.    Patient was given perioperative antibiotics: Anti-infectives    Start     Dose/Rate Route Frequency Ordered Stop   09/17/14 1600  ceFAZolin (ANCEF) IVPB 1 g/50 mL premix     1 g 100 mL/hr over 30 Minutes Intravenous Every 6 hours 09/17/14 1248 09/17/14 2237   09/17/14 0740  ceFAZolin (ANCEF) IVPB 2 g/50 mL premix     2 g 100 mL/hr over 30 Minutes Intravenous On call to O.R. 09/17/14 0740 09/17/14 0955       Patient was given sequential compression devices, early ambulation, and chemoprophylaxis to prevent DVT.  Patient benefited maximally from hospital stay and there were no complications.    Recent vital signs: Patient Vitals for the past 24 hrs:  BP Temp Temp src Pulse Resp SpO2  09/18/14 0629 120/90 mmHg 98.1 F (36.7 C)  Oral 67 16 99 %  09/18/14 0024 119/71 mmHg 98.3 F (36.8 C) - 61 16 97 %  09/18/14 0015 - - - - 16 98 %  09/17/14 2209 119/72 mmHg 98.8 F (37.1 C) Oral 78 16 97 %  09/17/14 2000 - - - - 16 97 %  09/17/14 1819 127/78 mmHg 98.6 F (37 C) Oral 62 16 98 %  09/17/14 1629 117/88 mmHg 98.3 F (36.8 C) Oral 71 18 99 %  09/17/14 1600 - - - - 16 100 %  09/17/14 1511 (!) 138/94 mmHg 98.6 F (37 C) Oral 87 18 96 %  09/17/14 1342 130/78 mmHg 97.6 F (36.4 C) Oral 62 16 99 %  09/17/14 1237 (!) 145/80 mmHg 97.7 F (36.5 C) - (!) 48 14 99 %  09/17/14 1215 (!) 155/92 mmHg 97.9 F (36.6 C) - (!) 48 12 100 %  09/17/14 1200 133/64 mmHg - - (!) 50 13 100 %  09/17/14 1145 (!) 155/86 mmHg - - (!) 57 18 100 %  09/17/14 1137 (!) 147/83 mmHg 97.5 F (36.4 C) - 68 (!) 9 100 %     Recent laboratory studies:  Recent Labs  09/18/14 0426  WBC 8.9  HGB 10.4*  HCT 30.7*  PLT 196  NA 134*  K 4.1  CL 103  CO2 28  BUN 10  CREATININE 0.60  GLUCOSE 107*  CALCIUM 8.4     Discharge Medications:  Medication List    STOP taking these medications        baclofen 10 MG tablet  Commonly known as:  LIORESAL     HYDROmorphone 2 MG tablet  Commonly known as:  DILAUDID     ondansetron 4 MG tablet  Commonly known as:  ZOFRAN     rivaroxaban 10 MG Tabs tablet  Commonly known as:  XARELTO      TAKE these medications        aspirin EC 325 MG tablet  Take 1 tablet (325 mg total) by mouth daily.     calcium carbonate 500 MG chewable tablet  Commonly known as:  TUMS - dosed in mg elemental calcium  Chew 1 tablet by mouth 2 (two) times daily.     ibuprofen 200 MG tablet  Commonly known as:  ADVIL,MOTRIN  Take 800 mg by mouth every 6 (six) hours as needed (pain).     losartan-hydrochlorothiazide 50-12.5 MG per tablet  Commonly known as:  HYZAAR  Take 1 tablet by mouth every morning.     meclizine 25 MG tablet  Commonly known as:  ANTIVERT  Take 1 tablet (25 mg total) by mouth 3 (three)  times daily as needed for dizziness.     sennosides-docusate sodium 8.6-50 MG tablet  Commonly known as:  SENOKOT-S  Take 2 tablets by mouth daily.     Vitamin D-3 1000 UNITS Caps  Take 1 capsule by mouth 2 (two) times daily.        Diagnostic Studies: Dg C-arm 1-60 Min-no Report  09/17/2014   CLINICAL DATA: surgery   C-ARM 1-60 MINUTES  Fluoroscopy was utilized by the requesting physician.  No radiographic  interpretation.    Dg Hip Unilat With Pelvis 1v Right  09/17/2014   CLINICAL DATA:  Right-sided hip replacement  EXAM: RIGHT HIP (WITH PELVIS) 1 VIEW; DG C-ARM 1-60 MIN - NRPT [facility]  COMPARISON:  None.  FLUOROSCOPY TIME:  Radiation Exposure Index (as provided by the fluoroscopic device): Not available  If the device does not provide the exposure index:  Fluoroscopy Time:  31 seconds  Number of Acquired Images:  2  FINDINGS: A right hip replacement is noted. No acute bony abnormality is seen. The femoral component is well seated in the acetabular component  IMPRESSION: Status post right hip replacement.   Electronically Signed   By: Inez Catalina M.D.   On: 09/17/2014 11:34   Dg Hip Port Unilat With Pelvis 1v Right  09/17/2014   CLINICAL DATA:  Status post right total hip arthroplasty.  EXAM: RIGHT HIP (WITH PELVIS) 1 VIEW PORTABLE  COMPARISON:  Same day.  FINDINGS: Bilateral total hip arthroplasties are noted. Most recent is seen on the right. The femoral and acetabular components appear to be well situated. No fracture or dislocation is noted.  IMPRESSION: Status post right total hip arthroplasty. These results were called by telephone at the time of interpretation on 09/17/2014 at 12:13 pm to Bridget Hartshorn, nurse in PACU, who verbally acknowledged these results.   Electronically Signed   By: Marijo Conception, M.D.   On: 09/17/2014 12:14    Disposition:  To home      Discharge Instructions    Call MD / Call 911    Complete by:  As directed   If you experience chest pain or shortness of  breath, CALL 911 and be transported to the hospital emergency room.  If you develope a fever above 101 F, pus (white  drainage) or increased drainage or redness at the wound, or calf pain, call your surgeon's office.     Constipation Prevention    Complete by:  As directed   Drink plenty of fluids.  Prune juice may be helpful.  You may use a stool softener, such as Colace (over the counter) 100 mg twice a day.  Use MiraLax (over the counter) for constipation as needed.     Diet - low sodium heart healthy    Complete by:  As directed      Discharge instructions    Complete by:  As directed   Increase activities as comfort allows. You can get your hip dressing wet daily in the shower. Try to leave your current dressing on and in place until your outpatient follow-up.     Discharge patient    Complete by:  As directed      Increase activity slowly as tolerated    Complete by:  As directed            Follow-up Information    Follow up with Mcarthur Rossetti, MD In 2 weeks.   Specialty:  Orthopedic Surgery   Contact information:   [place] [address] [phone]        Signed: Mcarthur Rossetti 09/18/2014, 8:58 AM

## 2014-09-18 NOTE — Plan of Care (Signed)
Problem: Discharge Progression Outcomes Goal: Anticoagulant follow-up in place Outcome: Not Applicable Date Met:  09/12/38 asa

## 2014-09-20 ENCOUNTER — Encounter: Payer: Self-pay | Admitting: Internal Medicine

## 2014-09-20 NOTE — Op Note (Signed)
Sharon Riddle                ACCOUNT NO.:  [id number]  MEDICAL RECORD NO.:  [id number]  LOCATION:                                 FACILITY:  PHYSICIAN:  Sharon Riddle, M.D.DATE OF BIRTH:  [birth year]  DATE OF PROCEDURE:  09/17/2014 DATE OF DISCHARGE:  09/18/2014                              OPERATIVE REPORT   PREOPERATIVE DIAGNOSIS:  Primary osteoarthritis and degenerative joint disease, right hip.  POSTOPERATIVE DIAGNOSIS:  Primary osteoarthritis and degenerative joint disease, right hip.  PROCEDURE:  Right total hip arthroplasty through direct anterior approach.  IMPLANTS:  DePuy Sector Gription acetabular component size 50, size 32+ 0 neutral polyethylene liner, size 11, Corail femoral component with standard offset, size 32+ 1 ceramic hip ball.  SURGEON:  Jean Rosenthal, M.D.  ASSISTANT:  Erskine Emery, PA-C.  ANESTHESIA:  General.  ANTIBIOTICS:  2 g IV Ancef.  BLOOD LOSS:  550 mL.  COMPLICATIONS:  None.  INDICATIONS:  Sharon Riddle is a pleasant 54 year old female with debilitating arthritis involving both of her hips.  She underwent a successful left total hip arthroplasty through a posterior approach back in late October 2015 and did incredibly well with this.  However, for next step, she wanted to have this done through a direct anterior approach.  As much as I stressed that having it done by surgeon in the posterior approach would be absolutely fine, she still wish to have this through the front to avoid any type of hip precautions for even a short period of time.  She understands still there is risk of acute blood loss anemia, nerve and vessel injury, fracture, infection, dislocation, and DVT.  She understands the goals are decreased pain, improved mobility, and overall improved quality of life.  PROCEDURE DESCRIPTION:  After informed consent was obtained, appropriate right hip was marked.  She was brought to the operating room.   General anesthesia was obtained while she was on the stretcher.  Traction boots were then placed on her feet.  Next, she was placed supine on the Hana fracture table with perineal post in place and both legs in inline skeletal traction devices with no traction applied.  Her right operative hip was prepped and draped with DuraPrep and sterile drapes.  A time-out was called.  She was identified as correct patient, correct right hip. I then made incision inferior and posterior to the anterior superior iliac spine and carried obliquely down the leg.  I dissected down to the tensor fascia lata muscle and tensor fascia was then divided longitudinally, so I could proceed with a direct anterior approach to the hip.  We identified and cauterized the lateral femoral circumflex vessels and then identified the hip capsule.  We placed the Cobra retractors around the lateral neck and up underneath the rectus femoris on the medial neck.  I then opened the hip capsule in a L-type format finding large joint effusion and finding significant periarticular osteophytes.  I placed the Cobra retractors within the joint capsule and made my femoral neck cut with an oscillating saw and completed this with an osteotome.  I placed a corkscrew guide in the femoral head and removed the femoral head in  its entirety.  We then cleaned the acetabular debris including remnants of acetabular labrum, and I placed a bent Hohmann medially and then began reaming under direct visualization from a size 42 reamer in 2 mm increments up to a size 50. All reamers again were placed under direct visualization and the last reamer also under direct fluoroscopy, so I could obtain my depth of reaming, my inclination and anteversion.  I then placed the real DePuy Sector Gription acetabular component size 50 and apex hole eliminator and a 32+ 0 neutral polyethylene liner.  Attention was then turned to the femur.  With the leg externally  rotated to 100 degrees and extended to 80, I was able to place a Mueller retractor medially and a Hohmann retractor behind the greater trochanter.  I released the lateral joint capsule and used a box cutting osteotome into the femoral canal and a rongeur to lateralize.  We began broaching from a size 8 broach up to a size 11.  With the size 11, we used a Calcar Planer and then trialed a standard neck with a 32+ 1 hip ball.  We rolled the leg back over and up, and with traction and internal rotation reducing into the pelvis.  I was pleased with the rotation stability as well as offsetting and leg lengths under direct fluoroscopy.  We then dislocated the hip and removed the trial components.  I placed the real Corail femoral component size 11 with standard offset and the real 32+ 1 ceramic hip ball and reduced it back in the acetabulum stable.  We then copiously irrigated the soft tissue with normal saline solution.  I closed the joint capsule with interrupted #1 Ethibond suture followed by then infiltrating Exparel around the joint.  We then closed the tensor fascia with interrupted #1 Vicryl followed by 0 Vicryl in the deep tissue, 2-0 Vicryl in subcutaneous tissue, 4-0 Monocryl subcuticular stitch, and Steri-Strips on the skin.  We infiltrated the incision with 0.25% plain Marcaine.  An Aquacel dressing was then applied, and she was taken off the Hana table, awakened, extubated, and taken to the recovery room in stable condition.  All final counts were correct.  There were no complications noted.  Of note, Erskine Emery, PA-C assisted in the entire case and his assistance was crucial for facilitating all aspects of this case.     Sharon Riddle, M.D.     CYB/MEDQ  D:  09/17/2014  T:  09/17/2014  Job:  [id number]

## 2014-11-11 ENCOUNTER — Encounter: Payer: Self-pay | Admitting: Family Medicine

## 2014-11-17 ENCOUNTER — Ambulatory Visit (AMBULATORY_SURGERY_CENTER): Payer: Self-pay | Admitting: *Deleted

## 2014-11-17 VITALS — Ht 65.0 in | Wt 136.0 lb

## 2014-11-17 DIAGNOSIS — Z1211 Encounter for screening for malignant neoplasm of colon: Secondary | ICD-10-CM

## 2014-11-17 MED ORDER — MOVIPREP 100 G PO SOLR: 1.0000 | Freq: Once | ORAL | Status: DC

## 2014-11-17 NOTE — Progress Notes (Signed)
No egg or soy allergy. No anesthesia problems.  No home O2.  No diet meds.  Pt refused emmi.

## 2014-11-18 ENCOUNTER — Encounter: Payer: Self-pay | Admitting: Internal Medicine

## 2014-11-30 ENCOUNTER — Ambulatory Visit (INDEPENDENT_AMBULATORY_CARE_PROVIDER_SITE_OTHER): Payer: BC Managed Care – PPO | Admitting: Family Medicine

## 2014-11-30 ENCOUNTER — Encounter: Payer: Self-pay | Admitting: Family Medicine

## 2014-11-30 VITALS — BP 120/80 | HR 66 | Temp 97.7°F | Ht 64.5 in | Wt 136.5 lb

## 2014-11-30 DIAGNOSIS — Z111 Encounter for screening for respiratory tuberculosis: Secondary | ICD-10-CM | POA: Diagnosis not present

## 2014-11-30 DIAGNOSIS — Z0184 Encounter for antibody response examination: Secondary | ICD-10-CM | POA: Diagnosis not present

## 2014-11-30 DIAGNOSIS — Z0000 Encounter for general adult medical examination without abnormal findings: Secondary | ICD-10-CM | POA: Diagnosis not present

## 2014-11-30 NOTE — Progress Notes (Signed)
Pre visit review using our clinic review tool, if applicable. No additional management support is needed unless otherwise documented below in the visit note. 

## 2014-11-30 NOTE — Patient Instructions (Signed)
Stop at lab on way put for titers.

## 2014-11-30 NOTE — Addendum Note (Signed)
Addended by: Carter Kitten on: 11/30/2014 03:10 PM   Modules accepted: Orders

## 2014-11-30 NOTE — Progress Notes (Signed)
The patient is here for annual wellness exam and preventative care.   She is doing well overall.  She is exercsing 5 days a week at [facility].  Hx of NSAID associated BP elevations: No longer on NSAI BP Readings from Last 3 Encounters:  11/25/13 120/82  10/27/13 132/90  07/21/13 134/86   BP Readings from Last 3 Encounters:  11/30/14 120/80  09/18/14 120/90  04/23/14 110/78  She is taking losartan/HCTZ daily.  At home 110-140/60-80  Swelling in hands.  No chest pain, no SOB.  Heathy eating habits.  Due for lab eval.   Recent  Right hip replacement in 09/2014 Dr. Ninfa Linden.  Wt Readings from Last 3 Encounters:  11/30/14 136 lb 8 oz (61.916 kg)  11/17/14 136 lb (61.689 kg)  09/17/14 135 lb (61.236 kg)     Review of Systems  Constitutional: Negative for fever, fatigue and unexpected weight change.  HENT: Negative for ear pain, congestion, sore throat, sneezing, trouble swallowing and sinus pressure.  Eyes: Negative for pain and itching.  Respiratory: Negative for cough, shortness of breath and wheezing.  Cardiovascular: Negative for chest pain, palpitations and leg swelling.  Gastrointestinal: Negative for nausea, abdominal pain, diarrhea, constipation and blood in stool.  Genitourinary: Negative for dysuria, hematuria, vaginal discharge, difficulty urinating and menstrual problem.  Musculoskeletal: Positive for arthralgias.  Skin: Negative for rash.  Neurological: Negative for syncope, weakness, light-headedness, numbness and headaches.  Psychiatric/Behavioral: Negative for confusion and dysphoric mood. The patient is not nervous/anxious.  Objective:   Physical Exam  Constitutional: Vital signs are normal. She appears well-developed and well-nourished. She is cooperative. Non-toxic appearance. She does not appear ill. No distress.  HENT:  Head: Normocephalic.  Right Ear: Hearing, tympanic membrane, external ear and ear canal normal.  Left Ear:  Hearing, tympanic membrane, external ear and ear canal normal.  Nose: Nose normal.  Eyes: Conjunctivae normal, EOM and lids are normal. Pupils are equal, round, and reactive to light. No foreign bodies found.  Neck: Trachea normal and normal range of motion. Neck supple. Carotid bruit is not present. No mass and no thyromegaly present.  Cardiovascular: Normal rate, regular rhythm, S1 normal, S2 normal, normal heart sounds and intact distal pulses. Exam reveals no gallop.  No murmur heard.  Pulmonary/Chest: Effort normal and breath sounds normal. No respiratory distress. She has no wheezes. She has no rhonchi. She has no rales.  Abdominal: Soft. Normal appearance and bowel sounds are normal. She exhibits no distension, no fluid wave, no abdominal bruit and no mass. There is no hepatosplenomegaly. There is no tenderness. There is no rebound, no guarding and no CVA tenderness. No hernia.  Lymphadenopathy:  She has no cervical adenopathy.  She has no axillary adenopathy.  Neurological: She is alert. She has normal strength. No cranial nerve deficit or sensory deficit.  Skin: Skin is warm, dry and intact. No rash noted.  Psychiatric: Her speech is normal and behavior is normal. Judgment normal. Her mood appears not anxious. Cognition and memory are normal. She does not exhibit a depressed mood.  Assessment & Plan:   The patient's preventative maintenance and recommended screening tests for an annual wellness exam were reviewed in full today.  Brought up to date unless services declined.  Counselled on the importance of diet, exercise, and its role in overall health and mortality.  The patient's FH and SH was reviewed, including their home life, tobacco status, and drug and alcohol status.   Vaccine: uptodate with Tdap and Flu . Need  tb skin test and MMR, varicella and hep B titer. Colon: Dr. Olevia Perches, colonoscopy age 5 given cancer risk in family...  Scheduled nextr week.  Mammo:  10/2014 nml. PAP/DVE: See GYN .  Nonsmoker  DEXA: Borderline nml, 2012.. Takes  Vit D 10, 000 units everyday.

## 2014-12-01 ENCOUNTER — Encounter: Payer: BC Managed Care – PPO | Admitting: Internal Medicine

## 2014-12-01 LAB — MEASLES/MUMPS/RUBELLA IMMUNITY
Mumps IgG: 291 AU/mL — ABNORMAL HIGH (ref <9.00)
RUBEOLA IGG: 137 [AU]/ml — AB (ref <25.00)
Rubella: 9.39 Index — ABNORMAL HIGH (ref <0.90)

## 2014-12-01 LAB — HEPATITIS B SURFACE ANTIBODY,QUALITATIVE: Hep B S Ab: NEGATIVE

## 2014-12-01 LAB — VARICELLA ZOSTER ANTIBODY, IGG: Varicella IgG: 666.5 Index — ABNORMAL HIGH (ref <135.00)

## 2014-12-02 LAB — TB SKIN TEST
INDURATION: 0 mm
TB SKIN TEST: NEGATIVE

## 2014-12-08 ENCOUNTER — Ambulatory Visit (AMBULATORY_SURGERY_CENTER): Payer: BC Managed Care – PPO | Admitting: Internal Medicine

## 2014-12-08 ENCOUNTER — Encounter: Payer: Self-pay | Admitting: Internal Medicine

## 2014-12-08 VITALS — BP 144/89 | HR 64 | Temp 97.9°F | Resp 15 | Ht 65.0 in | Wt 136.0 lb

## 2014-12-08 DIAGNOSIS — Z1211 Encounter for screening for malignant neoplasm of colon: Secondary | ICD-10-CM

## 2014-12-08 DIAGNOSIS — K635 Polyp of colon: Secondary | ICD-10-CM

## 2014-12-08 DIAGNOSIS — D124 Benign neoplasm of descending colon: Secondary | ICD-10-CM | POA: Diagnosis not present

## 2014-12-08 MED ORDER — SODIUM CHLORIDE 0.9 % IV SOLN: 500.0000 mL | INTRAVENOUS | Status: DC

## 2014-12-08 NOTE — Patient Instructions (Addendum)
Impressions/recommendations:  Polyp (handout given) Diverticulosis (handout given) High Fiber diet (handout given)  YOU HAD AN ENDOSCOPIC PROCEDURE TODAY AT [facility]:   Refer to the procedure report that was given to you for any specific questions about what was found during the examination.  If the procedure report does not answer your questions, please call your gastroenterologist to clarify.  If you requested that your care partner not be given the details of your procedure findings, then the procedure report has been included in a sealed envelope for you to review at your convenience later.  YOU SHOULD EXPECT: Some feelings of bloating in the abdomen. Passage of more gas than usual.  Walking can help get rid of the air that was put into your GI tract during the procedure and reduce the bloating. If you had a lower endoscopy (such as a colonoscopy or flexible sigmoidoscopy) you may notice spotting of blood in your stool or on the toilet paper. If you underwent a bowel prep for your procedure, you may not have a normal bowel movement for a few days.  Please Note:  You might notice some irritation and congestion in your nose or some drainage.  This is from the oxygen used during your procedure.  There is no need for concern and it should clear up in a day or so.  SYMPTOMS TO REPORT IMMEDIATELY:   Following lower endoscopy (colonoscopy or flexible sigmoidoscopy):  Excessive amounts of blood in the stool  Significant tenderness or worsening of abdominal pains  Swelling of the abdomen that is new, acute  Fever of 100F or higher  For urgent or emergent issues, a gastroenterologist can be reached at any hour by calling [phone].   DIET: Your first meal following the procedure should be a small meal and then it is ok to progress to your normal diet. Heavy or fried foods are harder to digest and may make you feel nauseous or bloated.  Likewise, meals heavy in dairy and  vegetables can increase bloating.  Drink plenty of fluids but you should avoid alcoholic beverages for 24 hours.  ACTIVITY:  You should plan to take it easy for the rest of today and you should NOT DRIVE or use heavy machinery until tomorrow (because of the sedation medicines used during the test).    FOLLOW UP: Our staff will call the number listed on your records the next business day following your procedure to check on you and address any questions or concerns that you may have regarding the information given to you following your procedure. If we do not reach you, we will leave a message.  However, if you are feeling well and you are not experiencing any problems, there is no need to return our call.  We will assume that you have returned to your regular daily activities without incident.  If any biopsies were taken you will be contacted by phone or by letter within the next 1-3 weeks.  Please call us at [phone] if you have not heard about the biopsies in 3 weeks.    SIGNATURES/CONFIDENTIALITY: You and/or your care partner have signed paperwork which will be entered into your electronic medical record.  These signatures attest to the fact that that the information above on your After Visit Summary has been reviewed and is understood.  Full responsibility of the confidentiality of this discharge information lies with you and/or your care-partner.

## 2014-12-08 NOTE — Op Note (Signed)
[facility]  [address] [place], [id number]   COLONOSCOPY PROCEDURE REPORT  PATIENT: Sharon Riddle, Sharon Riddle  MR#: [id number] BIRTHDATE: [birth year] , 45  yrs. old GENDER: female ENDOSCOPIST: Lafayette Dragon, MD REFERRED BY:Amy Cletis Athens, M.D. PROCEDURE DATE:  12/08/2014 PROCEDURE:   Colonoscopy, screening and Colonoscopy with snare polypectomy First Screening Colonoscopy - Avg.  risk and is 50 yrs.  old or older - No.  Prior Negative Screening - Now for repeat screening. 10 or more years since last screening  History of Adenoma - Now for follow-up colonoscopy & has been > or = to 3 yrs.  N/A  Polyps removed today? Yes ASA CLASS:   Class II INDICATIONS:Screening for colonic neoplasia, Colorectal Neoplasm Risk Assessment for this procedure is average risk, and prior colonoscopy September 2005 showed mild diverticulosis.  There is family history of colon polyps. MEDICATIONS: Monitored anesthesia care and Propofol 350 mg IV  DESCRIPTION OF PROCEDURE:   After the risks benefits and alternatives of the procedure were thoroughly explained, informed consent was obtained.  The digital rectal exam revealed no abnormalities of the rectum.   The LB Olympus Loaner [id number] endoscope was introduced through the anus and advanced to the cecum, which was identified by both the appendix and ileocecal valve. No adverse events experienced.   The quality of the prep was good.  (MoviPrep was used)  The instrument was then slowly withdrawn as the colon was fully examined. Estimated blood loss is zero unless otherwise noted in this procedure report.      COLON FINDINGS: A sessile polyp measuring 7 mm in size was found in the descending colon.  A polypectomy was performed with a cold snare.  The resection was complete, the polyp tissue was completely retrieved and sent to histology.   There was mild diverticulosis noted in the sigmoid colon with associated angulation, muscular hypertrophy  and tortuosity.  Retroflexed views revealed no abnormalities. The time to cecum =11.52 min. 72 Withdrawal time = 7.02   The scope was withdrawn and the procedure completed. COMPLICATIONS: There were no immediate complications.  ENDOSCOPIC IMPRESSION: 1.   Sessile polyp was found in the descending colon; polypectomy was performed with a cold snare 2.   There was mild diverticulosis noted in the sigmoid colon  RECOMMENDATIONS: 1.  Await pathology results 2.  High fiber diet Recall colonoscopy pending path report  eSigned:  Lafayette Dragon, MD 12/08/2014 12:25 PM   cc:   PATIENT NAME:  Sharon Riddle, Sharon Riddle MR#: [id number]

## 2014-12-08 NOTE — Progress Notes (Signed)
Pt to PACU. Awake and alert, pleased with MAC. Report to RN

## 2014-12-08 NOTE — Progress Notes (Signed)
Called to room to assist during endoscopic procedure.  Patient ID and intended procedure confirmed with present staff. Received instructions for my participation in the procedure from the performing physician.  

## 2014-12-09 ENCOUNTER — Telehealth: Payer: Self-pay | Admitting: *Deleted

## 2014-12-09 NOTE — Telephone Encounter (Signed)
  Follow up Call-  Call back number 12/08/2014  Post procedure Call Back phone  # [phone]  Permission to leave phone message Yes    [facility]

## 2014-12-14 ENCOUNTER — Encounter: Payer: Self-pay | Admitting: Internal Medicine

## 2015-01-03 ENCOUNTER — Other Ambulatory Visit: Payer: BC Managed Care – PPO

## 2015-01-03 DIAGNOSIS — Z0184 Encounter for antibody response examination: Secondary | ICD-10-CM

## 2015-01-04 LAB — HEPATITIS B SURFACE ANTIBODY, QUANTITATIVE: HEPATITIS B-POST: 1.2 m[IU]/mL

## 2015-01-15 ENCOUNTER — Other Ambulatory Visit: Payer: Self-pay | Admitting: Family Medicine

## 2015-04-14 ENCOUNTER — Ambulatory Visit (INDEPENDENT_AMBULATORY_CARE_PROVIDER_SITE_OTHER): Payer: BC Managed Care – PPO

## 2015-04-14 DIAGNOSIS — Z23 Encounter for immunization: Secondary | ICD-10-CM | POA: Diagnosis not present

## 2015-07-05 ENCOUNTER — Other Ambulatory Visit: Payer: Self-pay | Admitting: *Deleted

## 2015-07-05 MED ORDER — MECLIZINE HCL 25 MG PO TABS: 25.0000 mg | ORAL_TABLET | Freq: Two times a day (BID) | ORAL | Status: DC | PRN

## 2015-07-10 IMAGING — CR DG CHEST 2V
2 series · 2 of 2 positions shown · non-contrast
Comparison: None.

CLINICAL DATA: Preoperative evaluation for left total hip
arthroplasty, current history of hypertension

EXAM:
CHEST  2 VIEW

[w chest pa]
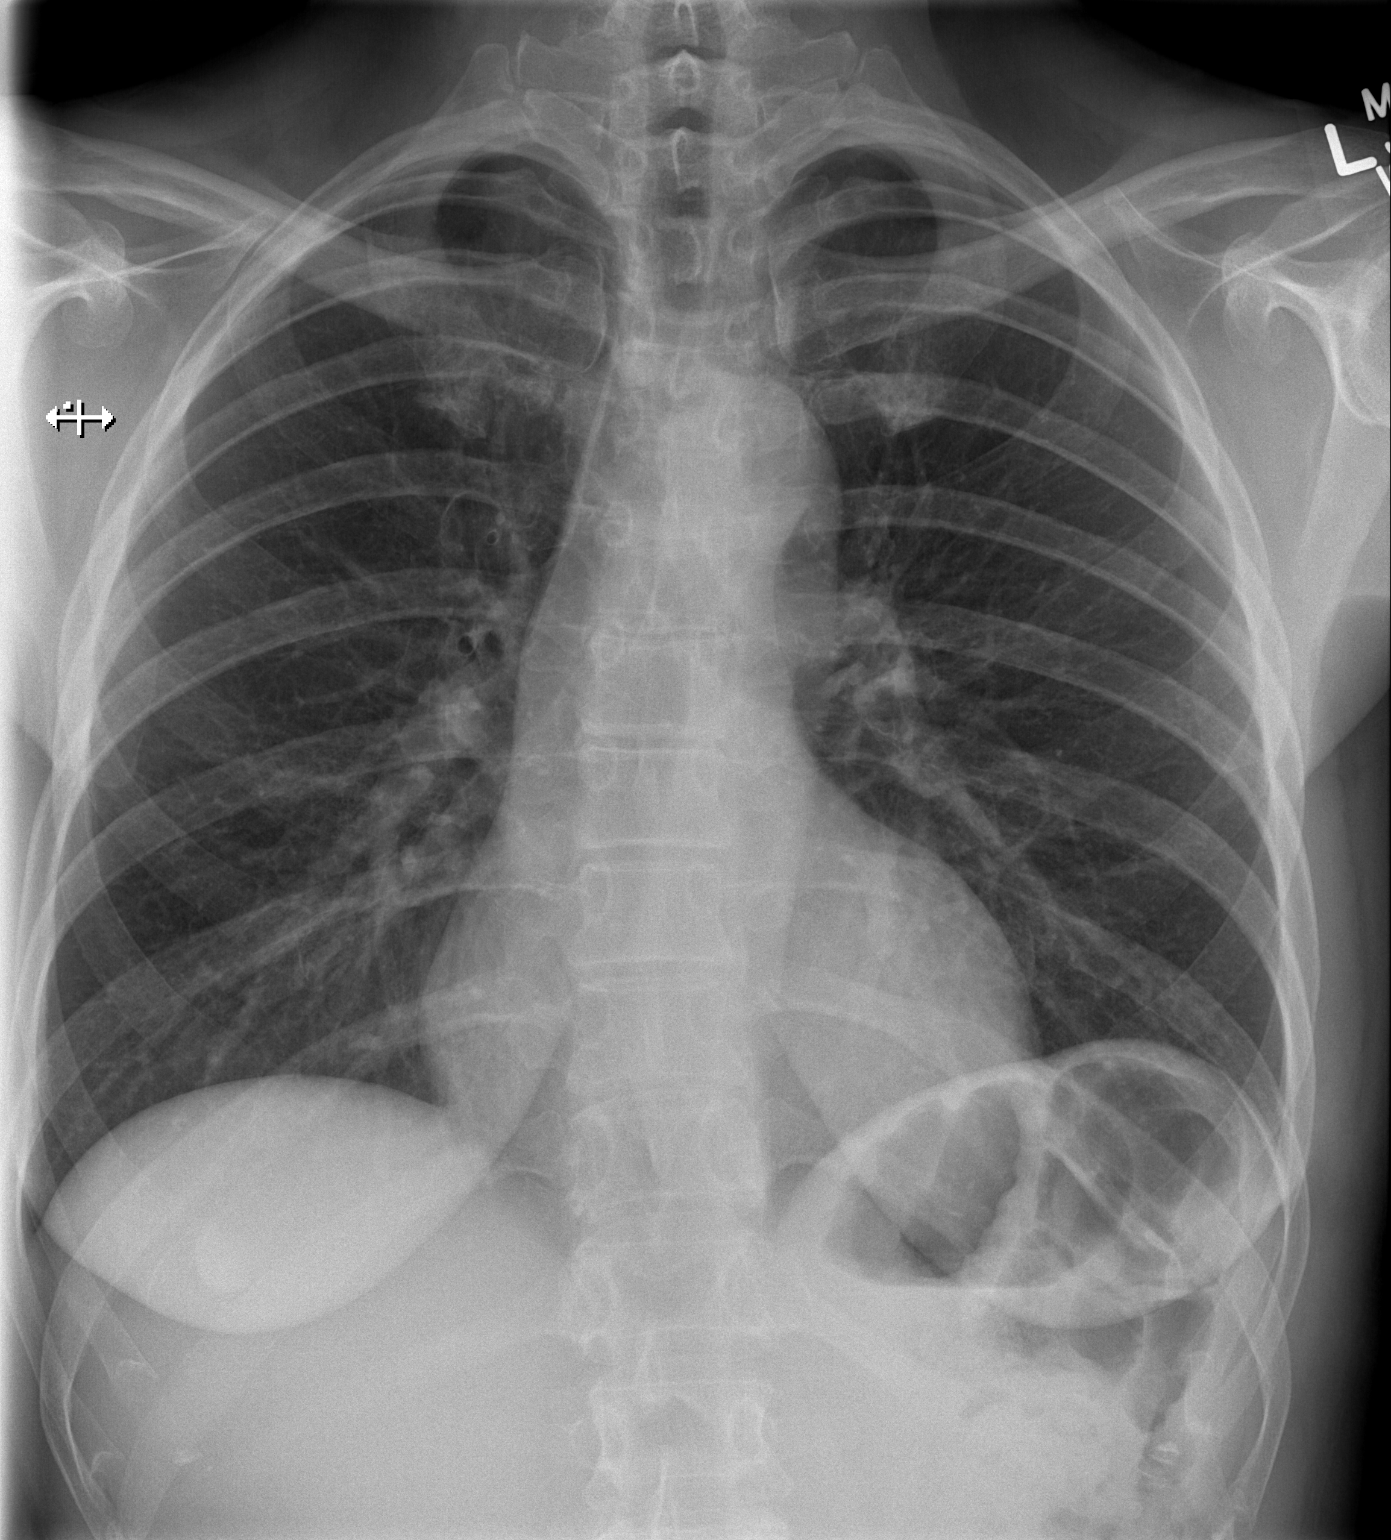

[w chest lat]
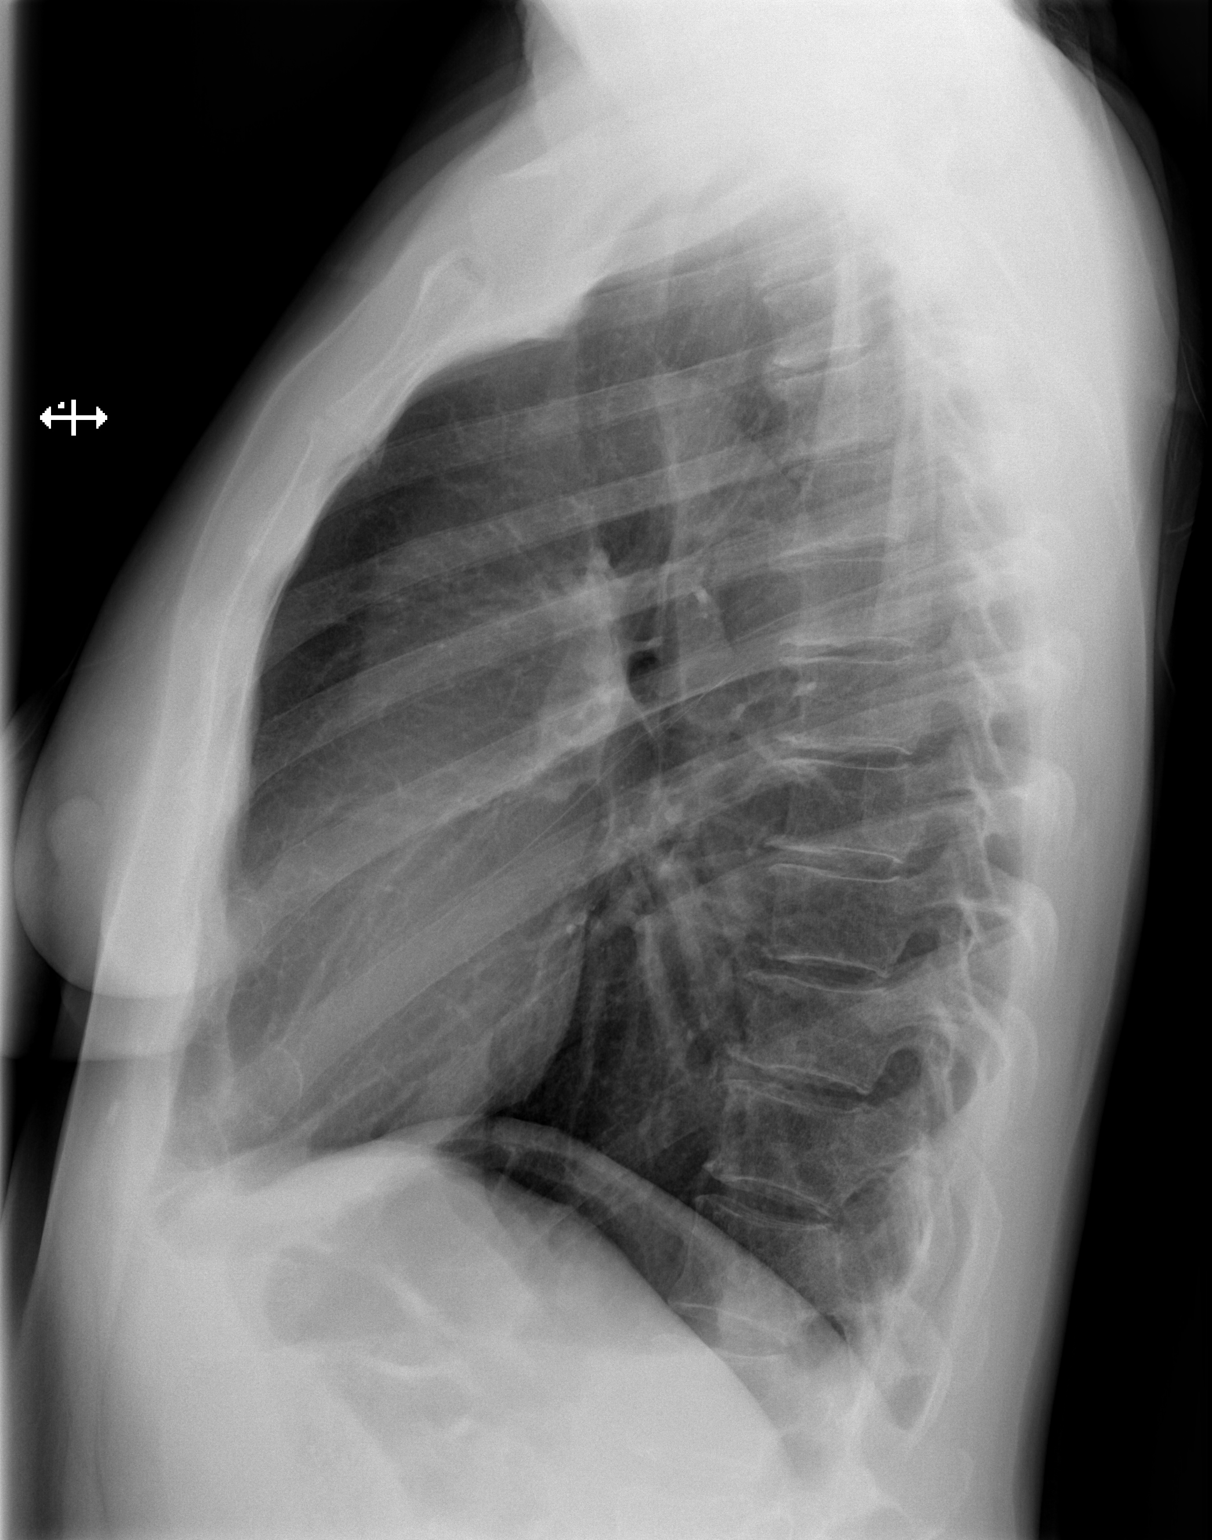

[2 of 2 positions shown; findings below may reference images not displayed]

FINDINGS: The heart size and mediastinal contours are within normal limits.
Both lungs are clear. The visualized skeletal structures are
unremarkable.
IMPRESSION: No active cardiopulmonary disease.

## 2015-07-25 ENCOUNTER — Ambulatory Visit (INDEPENDENT_AMBULATORY_CARE_PROVIDER_SITE_OTHER)
Admission: RE | Admit: 2015-07-25 | Discharge: 2015-07-25 | Disposition: A | Discharge status: Home / Self Care | Payer: BC Managed Care – PPO | Source: Ambulatory Visit | Attending: Family Medicine | Admitting: Family Medicine

## 2015-07-25 ENCOUNTER — Ambulatory Visit (INDEPENDENT_AMBULATORY_CARE_PROVIDER_SITE_OTHER): Payer: BC Managed Care – PPO | Admitting: Family Medicine

## 2015-07-25 ENCOUNTER — Encounter: Payer: Self-pay | Admitting: Family Medicine

## 2015-07-25 VITALS — BP 126/80 | HR 71 | Temp 97.8°F

## 2015-07-25 DIAGNOSIS — S82401A Unspecified fracture of shaft of right fibula, initial encounter for closed fracture: Secondary | ICD-10-CM

## 2015-07-25 DIAGNOSIS — S82891A Other fracture of right lower leg, initial encounter for closed fracture: Secondary | ICD-10-CM

## 2015-07-25 NOTE — Progress Notes (Signed)
Pre visit review using our clinic review tool, if applicable. No additional management support is needed unless otherwise documented below in the visit note. 
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
## 2015-08-22 ENCOUNTER — Ambulatory Visit (INDEPENDENT_AMBULATORY_CARE_PROVIDER_SITE_OTHER)
Admission: RE | Admit: 2015-08-22 | Discharge: 2015-08-22 | Disposition: A | Discharge status: Home / Self Care | Payer: BC Managed Care – PPO | Source: Ambulatory Visit | Attending: Family Medicine | Admitting: Family Medicine

## 2015-08-22 ENCOUNTER — Encounter: Payer: Self-pay | Admitting: Family Medicine

## 2015-08-22 ENCOUNTER — Ambulatory Visit (INDEPENDENT_AMBULATORY_CARE_PROVIDER_SITE_OTHER): Payer: BC Managed Care – PPO | Admitting: Family Medicine

## 2015-08-22 VITALS — BP 120/90 | HR 81 | Temp 98.2°F | Ht 64.5 in

## 2015-08-22 DIAGNOSIS — S82401D Unspecified fracture of shaft of right fibula, subsequent encounter for closed fracture with routine healing: Secondary | ICD-10-CM
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
## 2015-08-22 NOTE — Progress Notes (Signed)
Pre visit review using our clinic review tool, if applicable. No additional management support is needed unless otherwise documented below in the visit note. 

## 2015-11-21 IMAGING — DX DG HIP (WITH OR WITHOUT PELVIS) 1V PORT*R*
2 series · 2 of 2 positions shown · non-contrast
Comparison: Same day.

CLINICAL DATA: Status post right total hip arthroplasty.

EXAM:
RIGHT HIP (WITH PELVIS) 1 VIEW PORTABLE

[pelvis ap]
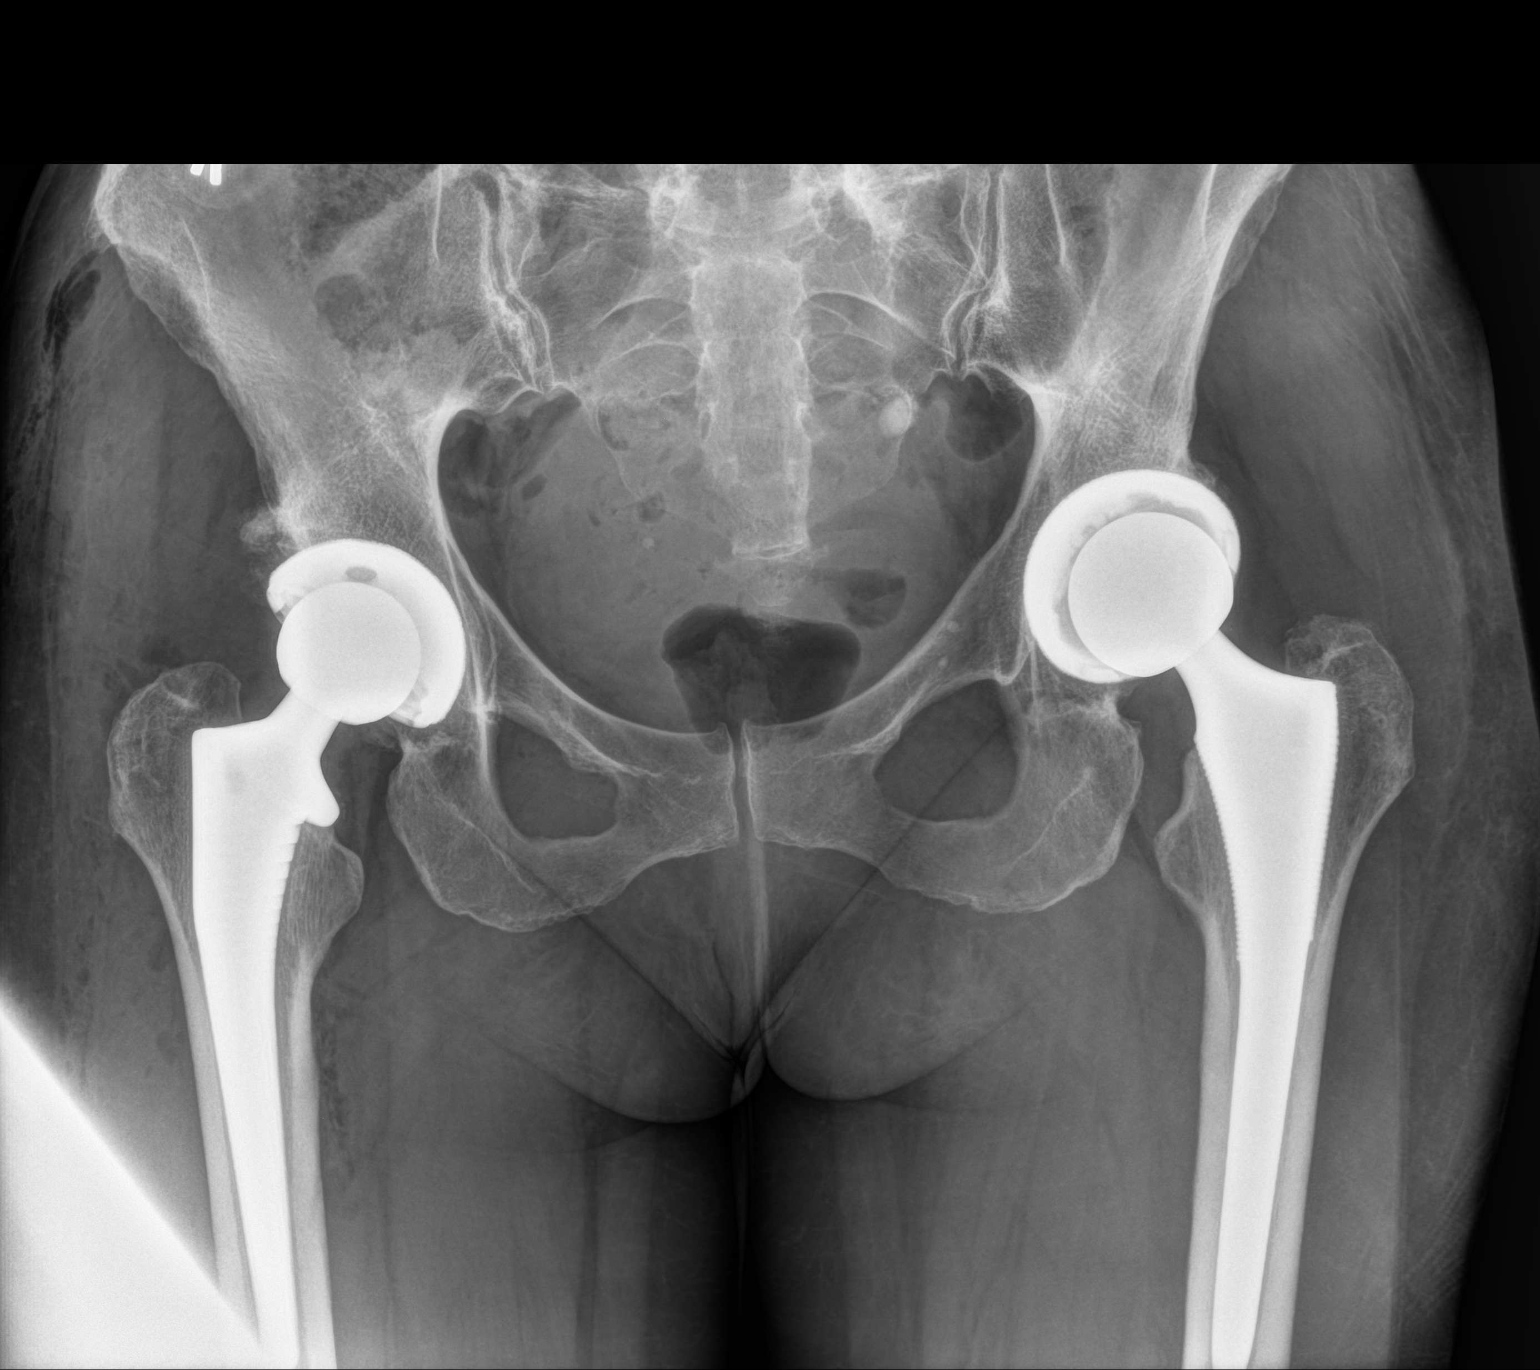

[hip x-table]
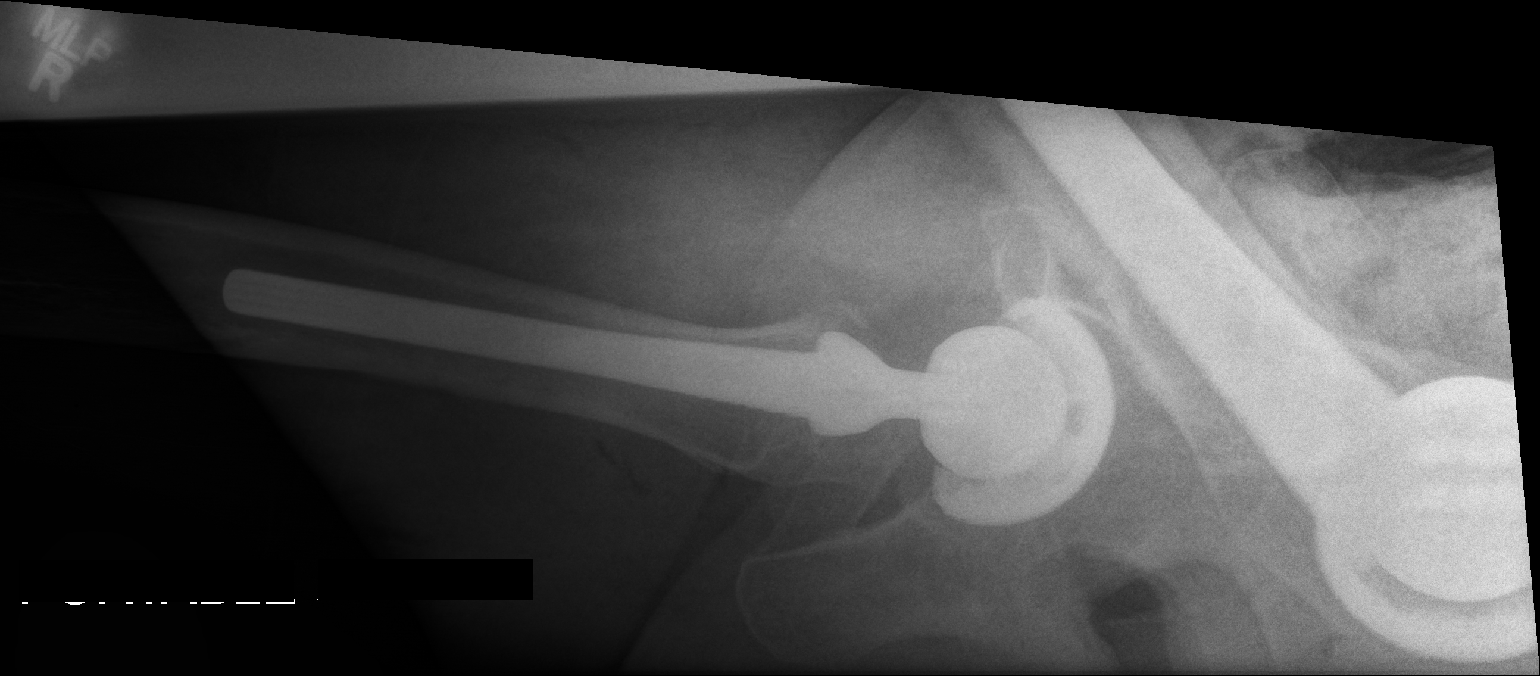

[2 of 2 positions shown; findings below may reference images not displayed]

FINDINGS: Bilateral total hip arthroplasties are noted. Most recent is seen on
the right. The femoral and acetabular components appear to be well
situated. No fracture or dislocation is noted.
IMPRESSION: Status post right total hip arthroplasty. These results were called
by telephone at the time of interpretation on 09/17/2014 at [DATE]
to Dharam Dhading, nurse in PACU, who verbally acknowledged these
results.

## 2015-12-05 ENCOUNTER — Encounter: Payer: Self-pay | Admitting: Family Medicine

## 2016-01-18 ENCOUNTER — Telehealth: Payer: Self-pay | Admitting: Family Medicine

## 2016-01-18 NOTE — Telephone Encounter (Signed)
Please call and schedule CPE with fasting labs prior for Dr. Bedsole.  

## 2016-01-20 NOTE — Telephone Encounter (Signed)
Left message asking pt to call office  °

## 2016-01-20 NOTE — Telephone Encounter (Signed)
Labs 10/13 cpx 10/19

## 2016-02-01 MED ORDER — LOSARTAN POTASSIUM-HCTZ 50-12.5 MG PO TABS: 1.0000 | ORAL_TABLET | Freq: Every day | ORAL | Status: DC

## 2016-02-01 NOTE — Addendum Note (Signed)
Addended by: Carter Kitten on: 02/01/2016 03:52 PM   Modules accepted: Orders

## 2016-02-01 NOTE — Telephone Encounter (Addendum)
Dr Silvio Pate called and states [facility] on [address] did not receive refill that was sent in on 01/18/2016.  Refill resent.

## 2016-04-27 ENCOUNTER — Other Ambulatory Visit (INDEPENDENT_AMBULATORY_CARE_PROVIDER_SITE_OTHER): Payer: BC Managed Care – PPO

## 2016-04-27 ENCOUNTER — Telehealth: Payer: Self-pay | Admitting: Family Medicine

## 2016-04-27 DIAGNOSIS — Z13220 Encounter for screening for lipoid disorders: Secondary | ICD-10-CM

## 2016-04-27 DIAGNOSIS — Z1159 Encounter for screening for other viral diseases: Secondary | ICD-10-CM

## 2016-04-27 LAB — LIPID PANEL
Cholesterol: 189 mg/dL (ref 0–200)
HDL: 54.6 mg/dL (ref >39.00)
LDL CALC: 121 mg/dL — AB (ref 0–99)
NONHDL: 134.33
Total CHOL/HDL Ratio: 3
Triglycerides: 67 mg/dL (ref 0.0–149.0)
VLDL: 13.4 mg/dL (ref 0.0–40.0)

## 2016-04-27 LAB — COMPREHENSIVE METABOLIC PANEL
ALT: 16 U/L (ref 0–35)
AST: 19 U/L (ref 0–37)
Albumin: 4.6 g/dL (ref 3.5–5.2)
Alkaline Phosphatase: 65 U/L (ref 39–117)
BUN: 7 mg/dL (ref 6–23)
CHLORIDE: 98 meq/L (ref 96–112)
CO2: 30 meq/L (ref 19–32)
Calcium: 9.6 mg/dL (ref 8.4–10.5)
Creatinine, Ser: 0.7 mg/dL (ref 0.40–1.20)
GFR: 92.26 mL/min (ref >60.00)
GLUCOSE: 97 mg/dL (ref 70–99)
POTASSIUM: 3.8 meq/L (ref 3.5–5.1)
SODIUM: 135 meq/L (ref 135–145)
Total Bilirubin: 0.8 mg/dL (ref 0.2–1.2)
Total Protein: 6.9 g/dL (ref 6.0–8.3)

## 2016-04-27 NOTE — Telephone Encounter (Signed)
-----   Message from Ellamae Sia sent at 04/18/2016  3:31 PM EDT ----- Regarding: Lab orders for Friday, 10.13.17 Patient is scheduled for CPX labs, please order future labs, Thanks , Karna Christmas

## 2016-04-28 LAB — HEPATITIS C ANTIBODY: HCV AB: NEGATIVE

## 2016-04-29 ENCOUNTER — Other Ambulatory Visit: Payer: Self-pay | Admitting: Family Medicine

## 2016-05-03 ENCOUNTER — Ambulatory Visit (INDEPENDENT_AMBULATORY_CARE_PROVIDER_SITE_OTHER): Payer: BC Managed Care – PPO | Admitting: Family Medicine

## 2016-05-03 ENCOUNTER — Encounter: Payer: Self-pay | Admitting: Family Medicine

## 2016-05-03 VITALS — BP 94/81 | HR 66 | Temp 98.5°F | Ht 64.0 in | Wt 129.8 lb

## 2016-05-03 DIAGNOSIS — Z23 Encounter for immunization: Secondary | ICD-10-CM | POA: Diagnosis not present

## 2016-05-03 DIAGNOSIS — I10 Essential (primary) hypertension: Secondary | ICD-10-CM | POA: Diagnosis not present

## 2016-05-03 DIAGNOSIS — Z0000 Encounter for general adult medical examination without abnormal findings: Secondary | ICD-10-CM

## 2016-05-03 NOTE — Progress Notes (Signed)
Pre visit review using our clinic review tool, if applicable. No additional management support is needed unless otherwise documented below in the visit note. 

## 2016-05-03 NOTE — Patient Instructions (Signed)
Keep up with healthy lifestyle!

## 2016-05-03 NOTE — Progress Notes (Signed)
The patient is here for annual wellness exam and preventative care.   She is doing well overall.   Hx of NSAID associated BP elevations: No longer on NSAIDs BP Readings from Last 3 Encounters:  05/03/16 94/81  08/22/15 120/90  07/25/15 126/80  She is taking losartan/HCTZ daily.  At home94/80, no light  No swelling ankle.  No chest pain, no SOB.  Heathy eating habits.  Exercise:  She is exercsing 5 days a week at [facility]. 10 miles running a week. Body mass index is 22.27 kg/m.  Recent  Right hip replacement in 09/2014 Dr. Ninfa Linden.  Wt Readings from Last 3 Encounters:  05/03/16 129 lb 12 oz (58.9 kg)  12/08/14 136 lb (61.7 kg)  11/30/14 136 lb 8 oz (61.9 kg)   Reviewed labs in detail with pt  Lab Results  Component Value Date   CHOL 189 04/27/2016   HDL 54.60 04/27/2016   LDLCALC 121 (H) 04/27/2016   TRIG 67.0 04/27/2016   CHOLHDL 3 04/27/2016    Social History /Family History/Past Medical History reviewed and updated if needed.  Review of Systems  Constitutional: Negative for fever, fatigue and unexpected weight change.  HENT: Negative for ear pain, congestion, sore throat, sneezing, trouble swallowing and sinus pressure.  Eyes: Negative for pain and itching.  Respiratory: Negative for cough, shortness of breath and wheezing.  Cardiovascular: Negative for chest pain, palpitations and leg swelling.  Gastrointestinal: Negative for nausea, abdominal pain, diarrhea, constipation and blood in stool.  Genitourinary: Negative for dysuria, hematuria, vaginal discharge, difficulty urinating and menstrual problem.  Musculoskeletal: Positive for arthralgias.  Skin: Negative for rash.  Neurological: Negative for syncope, weakness, light-headedness, numbness and headaches.  Psychiatric/Behavioral: Negative for confusion and dysphoric mood. The patient is not nervous/anxious.  Objective:   Physical Exam  Constitutional: Vital signs are normal. She appears  well-developed and well-nourished. She is cooperative. Non-toxic appearance. She does not appear ill. No distress.  HENT:  Head: Normocephalic.  Right Ear: Hearing, tympanic membrane, external ear and ear canal normal.  Left Ear: Hearing, tympanic membrane, external ear and ear canal normal.  Nose: Nose normal.  Eyes: Conjunctivae normal, EOM and lids are normal. Pupils are equal, round, and reactive to light. No foreign bodies found.  Neck: Trachea normal and normal range of motion. Neck supple. Carotid bruit is not present. No mass and no thyromegaly present.  Cardiovascular: Normal rate, regular rhythm, S1 normal, S2 normal, normal heart sounds and intact distal pulses. Exam reveals no gallop.  No murmur heard.  Pulmonary/Chest: Effort normal and breath sounds normal. No respiratory distress. She has no wheezes. She has no rhonchi. She has no rales.  Abdominal: Soft. Normal appearance and bowel sounds are normal. She exhibits no distension, no fluid wave, no abdominal bruit and no mass. There is no hepatosplenomegaly. There is no tenderness. There is no rebound, no guarding and no CVA tenderness. No hernia.  Lymphadenopathy:  She has no cervical adenopathy.  She has no axillary adenopathy.  Neurological: She is alert. She has normal strength. No cranial nerve deficit or sensory deficit.  Skin: Skin is warm, dry and intact. No rash noted.  Psychiatric: Her speech is normal and behavior is normal. Judgment normal. Her mood appears not anxious. Cognition and memory are normal. She does not exhibit a depressed mood.  Assessment & Plan:   The patient's preventative maintenance and recommended screening tests for an annual wellness exam were reviewed in full today.  Brought up to date unless services  declined.  Counselled on the importance of diet, exercise, and its role in overall health and mortality.  The patient's FH and SH was reviewed, including their home life, tobacco  status, and drug and alcohol status.   Vaccine: uptodate with Tdap, given flu today.  Colon: Dr. Olevia Perches, 2016  sessile polyp, hyperplastic, repeat in 10 years Mammo: 11/2015 nml. PAP/DVE: See GYN . HIV refused  Nonsmoker  DEXA: Borderline nml, 2012... Due this year... 5 year follow up.

## 2016-05-03 NOTE — Assessment & Plan Note (Signed)
Well controlled. Continue current medication.  

## 2016-05-07 ENCOUNTER — Other Ambulatory Visit (INDEPENDENT_AMBULATORY_CARE_PROVIDER_SITE_OTHER): Payer: BC Managed Care – PPO | Admitting: Family Medicine

## 2016-05-07 DIAGNOSIS — Z111 Encounter for screening for respiratory tuberculosis: Secondary | ICD-10-CM | POA: Diagnosis not present

## 2016-05-18 ENCOUNTER — Encounter: Payer: Self-pay | Admitting: *Deleted

## 2016-05-18 LAB — TB SKIN TEST
Induration: 0 mm
TB SKIN TEST: NEGATIVE

## 2016-07-26 ENCOUNTER — Other Ambulatory Visit: Payer: Self-pay | Admitting: Family Medicine
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
## 2016-11-21 ENCOUNTER — Encounter: Payer: Self-pay | Admitting: Family Medicine

## 2016-11-22 MED ORDER — MECLIZINE HCL 25 MG PO TABS: 25.0000 mg | ORAL_TABLET | Freq: Three times a day (TID) | ORAL | 0 refills | Status: DC | PRN

## 2016-12-21 ENCOUNTER — Encounter: Payer: Self-pay | Admitting: Family Medicine

## 2017-05-01 ENCOUNTER — Other Ambulatory Visit (INDEPENDENT_AMBULATORY_CARE_PROVIDER_SITE_OTHER): Payer: BC Managed Care – PPO

## 2017-05-01 DIAGNOSIS — Z111 Encounter for screening for respiratory tuberculosis: Secondary | ICD-10-CM

## 2017-05-01 DIAGNOSIS — Z1329 Encounter for screening for other suspected endocrine disorder: Secondary | ICD-10-CM | POA: Diagnosis not present

## 2017-05-01 DIAGNOSIS — Z0000 Encounter for general adult medical examination without abnormal findings: Secondary | ICD-10-CM

## 2017-05-01 DIAGNOSIS — Z13220 Encounter for screening for lipoid disorders: Secondary | ICD-10-CM | POA: Diagnosis not present

## 2017-05-01 NOTE — Addendum Note (Signed)
Addended by: Mady Haagensen on: 05/01/2017 05:38 PM   Modules accepted: Orders

## 2017-05-02 LAB — COMPREHENSIVE METABOLIC PANEL
ALBUMIN: 4.6 g/dL (ref 3.5–5.2)
ALK PHOS: 78 U/L (ref 39–117)
ALT: 13 U/L (ref 0–35)
AST: 19 U/L (ref 0–37)
BILIRUBIN TOTAL: 0.4 mg/dL (ref 0.2–1.2)
BUN: 11 mg/dL (ref 6–23)
CALCIUM: 9.7 mg/dL (ref 8.4–10.5)
CO2: 30 mEq/L (ref 19–32)
Chloride: 98 mEq/L (ref 96–112)
Creatinine, Ser: 0.66 mg/dL (ref 0.40–1.20)
GFR: 98.38 mL/min (ref >60.00)
GLUCOSE: 91 mg/dL (ref 70–99)
POTASSIUM: 4 meq/L (ref 3.5–5.1)
Sodium: 136 mEq/L (ref 135–145)
TOTAL PROTEIN: 7.4 g/dL (ref 6.0–8.3)

## 2017-05-02 LAB — CBC WITH DIFFERENTIAL/PLATELET
Basophils Absolute: 0.2 10*3/uL — ABNORMAL HIGH (ref 0.0–0.1)
Basophils Relative: 2.6 % (ref 0.0–3.0)
EOS ABS: 0.4 10*3/uL (ref 0.0–0.7)
Eosinophils Relative: 7.1 % — ABNORMAL HIGH (ref 0.0–5.0)
HEMATOCRIT: 40.4 % (ref 36.0–46.0)
Hemoglobin: 13.5 g/dL (ref 12.0–15.0)
LYMPHS PCT: 40.6 % (ref 12.0–46.0)
Lymphs Abs: 2.5 10*3/uL (ref 0.7–4.0)
MCHC: 33.4 g/dL (ref 30.0–36.0)
MCV: 90.4 fl (ref 78.0–100.0)
Monocytes Absolute: 0.5 10*3/uL (ref 0.1–1.0)
Monocytes Relative: 8.9 % (ref 3.0–12.0)
Neutro Abs: 2.5 10*3/uL (ref 1.4–7.7)
Neutrophils Relative %: 40.8 % — ABNORMAL LOW (ref 43.0–77.0)
Platelets: 302 10*3/uL (ref 150.0–400.0)
RBC: 4.47 Mil/uL (ref 3.87–5.11)
RDW: 13.9 % (ref 11.5–15.5)
WBC: 6.2 10*3/uL (ref 4.0–10.5)

## 2017-05-02 LAB — LIPID PANEL
CHOLESTEROL: 205 mg/dL — AB (ref 0–200)
HDL: 60.3 mg/dL (ref >39.00)
LDL Cholesterol: 121 mg/dL — ABNORMAL HIGH (ref 0–99)
NonHDL: 144.53
TRIGLYCERIDES: 120 mg/dL (ref 0.0–149.0)
Total CHOL/HDL Ratio: 3
VLDL: 24 mg/dL (ref 0.0–40.0)

## 2017-05-02 LAB — TSH: TSH: 5.02 u[IU]/mL — AB (ref 0.35–4.50)

## 2017-05-03 ENCOUNTER — Other Ambulatory Visit: Payer: BC Managed Care – PPO

## 2017-05-03 LAB — QUANTIFERON TB GOLD ASSAY (BLOOD)
MITOGEN-NIL SO: 9.13 [IU]/mL
QUANTIFERON NIL VALUE: 0.05 [IU]/mL
QUANTIFERON TB AG MINUS NIL: 0.06 [IU]/mL
QUANTIFERON(R)-TB GOLD: NEGATIVE

## 2017-05-07 ENCOUNTER — Encounter: Payer: Self-pay | Admitting: Family Medicine

## 2017-05-07 ENCOUNTER — Ambulatory Visit (INDEPENDENT_AMBULATORY_CARE_PROVIDER_SITE_OTHER): Payer: BC Managed Care – PPO | Admitting: Family Medicine

## 2017-05-07 VITALS — BP 120/84 | HR 57 | Temp 98.5°F | Ht 64.0 in | Wt 133.5 lb

## 2017-05-07 DIAGNOSIS — R7989 Other specified abnormal findings of blood chemistry: Secondary | ICD-10-CM | POA: Diagnosis not present

## 2017-05-07 DIAGNOSIS — Z0000 Encounter for general adult medical examination without abnormal findings: Secondary | ICD-10-CM

## 2017-05-07 DIAGNOSIS — I10 Essential (primary) hypertension: Secondary | ICD-10-CM | POA: Diagnosis not present

## 2017-05-07 LAB — T4, FREE: Free T4: 0.7 ng/dL (ref 0.60–1.60)

## 2017-05-07 LAB — T3, FREE: T3 FREE: 3.3 pg/mL (ref 2.3–4.2)

## 2017-05-07 NOTE — Assessment & Plan Note (Signed)
Eval with free t3 and free t4

## 2017-05-07 NOTE — Progress Notes (Signed)
Subjective:    Patient ID: Sharon Riddle, female    DOB: Feb 26, 1961, 56 y.o.   MRN: [id number]  HPI  The patient is here for annual wellness exam and preventative care.     Mother with goiter and sister with hashimoto's  She is somewhat tired, no swelling, no constipation , no dry skin, no alopecia. Will re-eval with free t3 and t4.   reviewed labs in detail with pt.  Exercise:She is exercising 5 days a week at [facility]. 10 miles running a week.  Yoga. Diet: healthy diet, pescatarian.   Hypertension:   Blood pressure well controlled on losartan HCTZ. BP Readings from Last 3 Encounters:  05/07/17 120/84  05/03/16 94/81  08/22/15 120/90  Using medication without problems or lightheadedness: none Chest pain with exertion: none Edema: none Short of breath: none Average home BPs good control. Other issues:   Social History /Family History/Past Medical History reviewed in detail and updated in EMR if needed. Blood pressure 120/84, pulse (!) 57, temperature 98.5 F (36.9 C), temperature source Oral, height 5\' 4"  (1.626 m), weight 133 lb 8 oz (60.6 kg).  Review of Systems  Constitutional: Negative for fatigue and fever.  HENT: Negative for ear pain.   Eyes: Negative for pain.  Respiratory: Negative for chest tightness and shortness of breath.   Cardiovascular: Negative for chest pain, palpitations and leg swelling.  Gastrointestinal: Negative for abdominal pain.  Genitourinary: Negative for dysuria.       Objective:   Physical Exam  Constitutional: Vital signs are normal. She appears well-developed and well-nourished. She is cooperative.  Non-toxic appearance. She does not appear ill. No distress.  HENT:  Head: Normocephalic.  Right Ear: Hearing, tympanic membrane, external ear and ear canal normal. Tympanic membrane is not erythematous, not retracted and not bulging.  Left Ear: Hearing, tympanic membrane, external ear and ear canal normal. Tympanic membrane is not  erythematous, not retracted and not bulging.  Nose: Nose normal. No mucosal edema or rhinorrhea. Right sinus exhibits no maxillary sinus tenderness and no frontal sinus tenderness. Left sinus exhibits no maxillary sinus tenderness and no frontal sinus tenderness.  Mouth/Throat: Uvula is midline, oropharynx is clear and moist and mucous membranes are normal.  Eyes: Pupils are equal, round, and reactive to light. Conjunctivae, EOM and lids are normal. Lids are everted and swept, no foreign bodies found.  Neck: Trachea normal and normal range of motion. Neck supple. Carotid bruit is not present. No thyroid mass and no thyromegaly present.  Cardiovascular: Normal rate, regular rhythm, S1 normal, S2 normal, normal heart sounds, intact distal pulses and normal pulses.  Exam reveals no gallop and no friction rub.   No murmur heard. Pulmonary/Chest: Effort normal and breath sounds normal. No tachypnea. No respiratory distress. She has no decreased breath sounds. She has no wheezes. She has no rhonchi. She has no rales.  Abdominal: Soft. Normal appearance and bowel sounds are normal. She exhibits no distension, no fluid wave, no abdominal bruit and no mass. There is no hepatosplenomegaly. There is no tenderness. There is no rebound, no guarding and no CVA tenderness. No hernia.  Lymphadenopathy:    She has no cervical adenopathy.    She has no axillary adenopathy.  Neurological: She is alert. She has normal strength. No cranial nerve deficit or sensory deficit.  Skin: Skin is warm, dry and intact. No rash noted.  Psychiatric: Her speech is normal and behavior is normal. Judgment and thought content normal. Her mood appears not  anxious. Cognition and memory are normal. She does not exhibit a depressed mood.          Assessment & Plan:  The patient's preventative maintenance and recommended screening tests for an annual wellness exam were reviewed in full today. Brought up to date unless services  declined.  Counselled on the importance of diet, exercise, and its role in overall health and mortality. The patient's FH and SH was reviewed, including their home life, tobacco status, and drug and alcohol status.   Vaccine: uptodate with Tdap,  flu uptodate Colon: Dr. Olevia Perches, 2016  sessile polyp, hyperplastic, repeat in 10 years Mammo: 12/2016 nml. PAP/DVE: See GYN . HIV refused . Nonsmoker  DEXA: Borderline nml, 2012... q 5 years, due now.. Pt not interested.  on calcium and vit D.

## 2017-05-07 NOTE — Assessment & Plan Note (Signed)
Well controlled. Continue current medication.  

## 2017-07-14 ENCOUNTER — Other Ambulatory Visit: Payer: Self-pay | Admitting: Family Medicine

## 2017-08-13 LAB — HM PAP SMEAR: HPV, high-risk: NEGATIVE

## 2017-12-26 ENCOUNTER — Encounter: Payer: Self-pay | Admitting: Family Medicine

## 2018-05-21 ENCOUNTER — Other Ambulatory Visit: Payer: Self-pay | Admitting: Family Medicine

## 2018-05-21 ENCOUNTER — Encounter: Payer: Self-pay | Admitting: Family Medicine

## 2018-05-21 DIAGNOSIS — I10 Essential (primary) hypertension: Secondary | ICD-10-CM

## 2018-05-21 DIAGNOSIS — R7989 Other specified abnormal findings of blood chemistry: Secondary | ICD-10-CM

## 2018-05-22 ENCOUNTER — Other Ambulatory Visit (INDEPENDENT_AMBULATORY_CARE_PROVIDER_SITE_OTHER): Payer: BC Managed Care – PPO

## 2018-05-22 DIAGNOSIS — R7989 Other specified abnormal findings of blood chemistry: Secondary | ICD-10-CM | POA: Diagnosis not present

## 2018-05-22 DIAGNOSIS — I10 Essential (primary) hypertension: Secondary | ICD-10-CM

## 2018-05-22 DIAGNOSIS — Z111 Encounter for screening for respiratory tuberculosis: Secondary | ICD-10-CM

## 2018-05-22 LAB — BASIC METABOLIC PANEL
BUN: 10 mg/dL (ref 6–23)
CO2: 30 meq/L (ref 19–32)
Calcium: 9.5 mg/dL (ref 8.4–10.5)
Chloride: 97 mEq/L (ref 96–112)
Creatinine, Ser: 0.6 mg/dL (ref 0.40–1.20)
GFR: 109.41 mL/min (ref >60.00)
GLUCOSE: 105 mg/dL — AB (ref 70–99)
POTASSIUM: 3.6 meq/L (ref 3.5–5.1)
SODIUM: 133 meq/L — AB (ref 135–145)

## 2018-05-22 LAB — T4, FREE: FREE T4: 0.66 ng/dL (ref 0.60–1.60)

## 2018-05-22 LAB — LIPID PANEL
CHOLESTEROL: 205 mg/dL — AB (ref 0–200)
HDL: 65.3 mg/dL (ref >39.00)
LDL CALC: 125 mg/dL — AB (ref 0–99)
NonHDL: 139.34
Total CHOL/HDL Ratio: 3
Triglycerides: 74 mg/dL (ref 0.0–149.0)
VLDL: 14.8 mg/dL (ref 0.0–40.0)

## 2018-05-22 LAB — TSH: TSH: 3.92 u[IU]/mL (ref 0.35–4.50)

## 2018-05-24 LAB — QUANTIFERON-TB GOLD PLUS
NIL: 0.02 [IU]/mL
QUANTIFERON-TB GOLD PLUS: NEGATIVE
TB1-NIL: 0.07 [IU]/mL
TB2-NIL: 0.1 IU/mL

## 2018-05-26 ENCOUNTER — Ambulatory Visit (INDEPENDENT_AMBULATORY_CARE_PROVIDER_SITE_OTHER): Payer: BC Managed Care – PPO | Admitting: Family Medicine

## 2018-05-26 ENCOUNTER — Encounter: Payer: Self-pay | Admitting: Family Medicine

## 2018-05-26 VITALS — BP 126/82 | HR 76 | Ht 64.0 in | Wt 139.4 lb

## 2018-05-26 DIAGNOSIS — B354 Tinea corporis: Secondary | ICD-10-CM

## 2018-05-26 DIAGNOSIS — Z0000 Encounter for general adult medical examination without abnormal findings: Secondary | ICD-10-CM

## 2018-05-26 MED ORDER — NAFTIFINE HCL 2 % EX CREA: 1.0000 "application " | TOPICAL_CREAM | Freq: Every day | CUTANEOUS | 1 refills | Status: AC

## 2018-05-26 NOTE — Patient Instructions (Signed)
Great to see you today  I have sent in a prescription for naftifine to use once daily for 14 days  Enjoy your upcoming vacations!  See you next year  Keeping You Healthy  Get These Tests  Blood Pressure- Have your blood pressure checked by your healthcare provider at least once a year.  Normal blood pressure is 120/80.  Weight- Have your body mass index (BMI) calculated to screen for obesity.  BMI is a measure of body fat based on height and weight.  You can calculate your own BMI at [employer]velB[employer].it  Cholesterol- Have your cholesterol checked every year.  Diabetes- Have your blood sugar checked every year if you have high blood pressure, high cholesterol, a family history of diabetes or if you are overweight.  Pap Test - Have a pap test every 1 to 5 years if you have been sexually active.  If you are older than 65 and recent pap tests have been normal you may not need additional pap tests.  In addition, if you have had a hysterectomy  for benign disease additional pap tests are not necessary.  Mammogram-Yearly mammograms are essential for early detection of breast cancer  Screening for Colon Cancer- Colonoscopy starting at age 59. Screening may begin sooner depending on your family history and other health conditions.  Follow up colonoscopy as directed by your Gastroenterologist.  Screening for Osteoporosis- Screening begins at age 49 with bone density scanning, sooner if you are at higher risk for developing Osteoporosis.  Get these medicines  Calcium with Vitamin D- Your body requires 1200-1500 mg of Calcium a day and [phone] IU of Vitamin D a day.  You can only absorb 500 mg of Calcium at a time therefore Calcium must be taken in 2 or 3 separate doses throughout the day.  Hormones- Hormone therapy has been associated with increased risk for certain cancers and heart disease.  Talk to your healthcare provider about if you need relief from menopausal symptoms.  Aspirin-  Ask your healthcare provider about taking Aspirin to prevent Heart Disease and Stroke.  Get these Immuniztions  Flu shot- Every fall  Pneumonia shot- Once after the age of 51; if you are younger ask your healthcare provider if you need a pneumonia shot.  Tetanus- Every ten years.  Zostavax- Once after the age of 19 to prevent shingles.  Take these steps  Don't smoke- Your healthcare provider can help you quit. For tips on how to quit, ask your healthcare provider or go to www.smokefree.gov or call 1-800 QUIT-NOW.  Be physically active- Exercise 5 days a week for a minimum of 30 minutes.  If you are not already physically active, start slow and gradually work up to 30 minutes of moderate physical activity.  Try walking, dancing, bike riding, swimming, etc.  Eat a healthy diet- Eat a variety of healthy foods such as fruits, vegetables, whole grains, low fat milk, low fat cheeses, yogurt, lean meats, chicken, fish, eggs, dried beans, tofu, etc.  For more information go to www.thenutritionsource.org  Dental visit- Brush and floss teeth twice daily; visit your dentist twice a year.  Eye exam- Visit your Optometrist or Ophthalmologist yearly.  Drink alcohol in moderation- Limit alcohol intake to one drink or less a day.  Never drink and drive.  Depression- Your emotional health is as important as your physical health.  If you're feeling down or losing interest in things you normally enjoy, please talk to your healthcare provider.  Seat Belts- can save your  life; always wear one  Smoke/Carbon Monoxide detectors- These detectors need to be installed on the appropriate level of your home.  Replace batteries at least once a year.  Violence- If anyone is threatening or hurting you, please tell your healthcare provider.  Living Will/ Health care power of attorney- Discuss with your healthcare provider and family.

## 2018-05-26 NOTE — Progress Notes (Signed)
Subjective:    Patient ID: Sharon Riddle, female    DOB: Apr 16, 1961, 57 y.o.   MRN: [id number]  HPI This is a 57 yo female who presents today for CPE and to establish care. She is a Programme researcher, broadcasting/film/video at [employer], married to [place]. She has a daughter, adult son deceased. She has 3 grandchildren. She enjoys travel.     Last CPE- 05/07/17 Mammo- 12/26/17 Pap- gyn annually Colonoscopy- 12/07/16 Olevia Perches) Tdap- 01/02/10 Flu- annual Eye- regular Dental- regular Exercise- running, weights, daily, core/functional strength Diet- pescetarian, occasional red wine, adequate protein and calcium Stress- manageable   Past Medical History:  Diagnosis Date  . Arthritis   . Complication of anesthesia    Post anesthesia shivers  . History of kidney stones   . Hypertension    NSAID related  . Loose body of right knee 04/18/2012  . Osteoarthritis of left hip 05/13/2014  . PONV (postoperative nausea and vomiting)    N/V with pain medications  . Vertigo    hx of   Past Surgical History:  Procedure Laterality Date  . COLONOSCOPY    . KNEE ARTHROSCOPY  04/18/2012   Procedure: ARTHROSCOPY KNEE;  Surgeon: Johnny Bridge, MD;  Location: [facility];  Service: Orthopedics;  Laterality: Right;  Removal of Loose Bodies-2  . TONSILLECTOMY    . TOTAL HIP ARTHROPLASTY Left 05/13/2014   Procedure: LEFT TOTAL HIP ARTHROPLASTY;  Surgeon: Johnny Bridge, MD;  Location: [facility];  Service: Orthopedics;  Laterality: Left;  . TOTAL HIP ARTHROPLASTY Right 09/17/2014   Procedure: RIGHT TOTAL HIP ARTHROPLASTY ANTERIOR APPROACH;  Surgeon: Mcarthur Rossetti, MD;  Location: [facility];  Service: Orthopedics;  Laterality: Right;   Family History  Problem Relation Age of Onset  . Diabetes Mother   . Goiter Mother   . Lymphoma Mother        Deceased, 21  . Colon polyps Father   . Bladder Cancer Father        Deceased 34  . Addison's disease Sister   . Crohn's disease Son   . Healthy Daughter   .  Healthy Brother   . Colon cancer Neg Hx    Social History   Tobacco Use  . Smoking status: Never Smoker  . Smokeless tobacco: Never Used  Substance Use Topics  . Alcohol use: Yes    Alcohol/week: 0.0 standard drinks    Comment: social, red wine 1 glass 3-4 drinks per week  . Drug use: No     Review of Systems  Constitutional: Negative.   HENT: Negative.   Eyes: Negative.   Respiratory: Negative.   Cardiovascular: Negative.   Gastrointestinal: Negative.   Endocrine: Negative.   Genitourinary: Negative.   Musculoskeletal:       Occasional knee pain, does not limit activities.   Skin: Positive for rash (Two spots on inner thigh, thinks fungal from gym, slight improvement with over the counter antifungal cream).  Allergic/Immunologic: Negative.   Neurological: Negative.   Hematological: Negative.   Psychiatric/Behavioral: Negative.        Objective:   Physical Exam Physical Exam  Constitutional: She is oriented to person, place, and time. She appears well-developed and well-nourished. No distress.  HENT:  Head: Normocephalic and atraumatic.  Right Ear: External ear normal.  Left Ear: External ear normal.  Nose: Nose normal.  Mouth/Throat: Oropharynx is clear and moist. No oropharyngeal exudate.  Eyes: Conjunctivae are normal. Pupils are equal, round, and reactive to light.  Neck: Normal range of motion. Neck supple. No JVD present. No thyromegaly present.  Cardiovascular: Normal rate, regular rhythm, normal heart sounds and intact distal pulses.   Pulmonary/Chest: Effort normal and breath sounds normal. Right breast exhibits no inverted nipple, no mass, no nipple discharge, no skin change and no tenderness. Left breast exhibits no inverted nipple, no mass, no nipple discharge, no skin change and no tenderness. Breasts are symmetrical.  Abdominal: Soft. Bowel sounds are normal. She exhibits no distension and no mass. There is no tenderness. There is no rebound and no  guarding.  Lymphadenopathy:    She has no cervical adenopathy.  Neurological: She is alert and oriented to person, place, and time.   Skin: Skin is warm and dry. She is not diaphoretic.Upper inner thigh with two flat, round areas of hyperpigmentation, lighter in center.   Psychiatric: She has a normal mood and affect. Her behavior is normal. Judgment and thought content normal.  Vitals reviewed.     BP 126/82 (BP Location: Right Arm, Patient Position: Sitting, Cuff Size: Normal)   Pulse 76   Ht 5\' 4"  (1.626 m)   Wt 139 lb 6.4 oz (63.2 kg)   SpO2 97%   BMI 23.93 kg/m  Wt Readings from Last 3 Encounters:  05/26/18 139 lb 6.4 oz (63.2 kg)  05/07/17 133 lb 8 oz (60.6 kg)  05/03/16 129 lb 12 oz (58.9 kg)       Assessment & Plan:  1. Annual physical exam - Discussed and encouraged healthy lifestyle choices- adequate sleep, regular exercise, stress management and healthy food choices.  - reviewed lab results  2. Tinea corporis - Provided written and verbal information regarding diagnosis and treatment. - Naftifine HCl 2 % CREA; Apply 1 application topically daily for 14 days.  Dispense: 45 g; Refill: [place], FNP-BC  [facility]  05/29/2018 10:35 AM

## 2018-05-29 ENCOUNTER — Encounter: Payer: Self-pay | Admitting: Family Medicine

## 2018-07-10 ENCOUNTER — Other Ambulatory Visit: Payer: Self-pay | Admitting: Family Medicine

## 2018-07-18 ENCOUNTER — Other Ambulatory Visit: Payer: Self-pay | Admitting: Family Medicine

## 2018-07-18 DIAGNOSIS — I10 Essential (primary) hypertension: Secondary | ICD-10-CM

## 2018-07-18 MED ORDER — VALSARTAN-HYDROCHLOROTHIAZIDE 80-12.5 MG PO TABS: 1.0000 | ORAL_TABLET | Freq: Every day | ORAL | 3 refills | Status: DC

## 2018-07-18 NOTE — Progress Notes (Signed)
Unable to get losartan due to shortage. Will change to valsartan/hctz

## 2018-07-22 ENCOUNTER — Encounter: Payer: BC Managed Care – PPO | Admitting: Family Medicine

## 2018-08-20 ENCOUNTER — Encounter: Payer: Self-pay | Admitting: Family Medicine

## 2018-09-24 ENCOUNTER — Encounter: Payer: Self-pay | Admitting: Family Medicine

## 2018-09-24 NOTE — Progress Notes (Signed)
Letter to advise against travel due to recent travel to [place] and increased number of COVID19 cases.

## 2018-12-10 ENCOUNTER — Ambulatory Visit (INDEPENDENT_AMBULATORY_CARE_PROVIDER_SITE_OTHER): Payer: BC Managed Care – PPO

## 2018-12-10 DIAGNOSIS — Z23 Encounter for immunization: Secondary | ICD-10-CM | POA: Diagnosis not present

## 2019-01-19 ENCOUNTER — Telehealth: Payer: Self-pay | Admitting: Family Medicine

## 2019-01-19 NOTE — Telephone Encounter (Signed)
Called to schedule appt per mychart request. Lvm asking pt to call office.

## 2019-01-21 ENCOUNTER — Encounter: Payer: Self-pay | Admitting: Family Medicine

## 2019-01-21 ENCOUNTER — Ambulatory Visit (INDEPENDENT_AMBULATORY_CARE_PROVIDER_SITE_OTHER): Payer: BC Managed Care – PPO | Admitting: Family Medicine

## 2019-01-21 VITALS — BP 112/68 | HR 62 | Wt 136.0 lb

## 2019-01-21 DIAGNOSIS — R05 Cough: Secondary | ICD-10-CM

## 2019-01-21 DIAGNOSIS — R053 Chronic cough: Secondary | ICD-10-CM

## 2019-01-21 MED ORDER — BENZONATATE 100 MG PO CAPS: 100.0000 mg | ORAL_CAPSULE | Freq: Two times a day (BID) | ORAL | 1 refills | Status: DC | PRN

## 2019-01-21 NOTE — Progress Notes (Signed)
Virtual Visit via Video Note  I connected with Sharon Riddle on 01/21/19 at  8:05 AM EDT by a video enabled telemedicine application and verified that I am speaking with the correct person using two identifiers.  Location: Patient: In her car Provider: [place]   I discussed the limitations of evaluation and management by telemedicine and the availability of in person appointments. The patient expressed understanding and agreed to proceed.  History of Present Illness: This is a 58 year old female who requests virtual visit today to discuss an ongoing hacking cough.  She had a viral illness about 3 months ago and has continued to have an intermittent, dry, persistent cough.  She travel to the [place] at the beginning of the COVID-19 outbreak.  She has been tested and is COVID antibody positive.  She has not run a fever, no shortness of breath, normal exercise (she ran 3 miles yesterday), no night sweats, no weight loss, no wheeze, no acid reflux, no dizziness or lightheadedness, no postnasal drainage or allergic rhinitis symptoms.  The cough is not every day, she can go several days without coughing.  The cough does seem to be triggered by talking.  She notes that she does not cough much during the day but when her husband comes home from work and they are talking in the afternoon she will begin to cough.  It lasts for a couple of minutes and is often preceded by a tickle in her throat.  It stops on its own.  She has not taken any medication for it.  It has gotten better since onset.  No improvement over the last 1 month.  She has a mild concern for cancer because she has had 2 friends diagnosed with stage IV lung cancer who are non-smokers.  She bought a home blood pressure cuff and has been checking her blood pressure regularly.  It has been running normal.  It is the lowest it has ever been according to the patient.  She denies hypotension, dizziness, lightheadedness.  She has decreased  eating out significantly during pandemic and has been watching her salt intake at home.  Past Medical History:  Diagnosis Date  . Arthritis   . Complication of anesthesia    Post anesthesia shivers  . History of kidney stones   . Hypertension    NSAID related  . Loose body of right knee 04/18/2012  . Osteoarthritis of left hip 05/13/2014  . PONV (postoperative nausea and vomiting)    N/V with pain medications  . Vertigo    hx of   Past Surgical History:  Procedure Laterality Date  . COLONOSCOPY    . KNEE ARTHROSCOPY  04/18/2012   Procedure: ARTHROSCOPY KNEE;  Surgeon: Johnny Bridge, MD;  Location: [place];  Service: Orthopedics;  Laterality: Right;  Removal of Loose Bodies-2  . TONSILLECTOMY    . TOTAL HIP ARTHROPLASTY Left 05/13/2014   Procedure: LEFT TOTAL HIP ARTHROPLASTY;  Surgeon: Johnny Bridge, MD;  Location: [place];  Service: Orthopedics;  Laterality: Left;  . TOTAL HIP ARTHROPLASTY Right 09/17/2014   Procedure: RIGHT TOTAL HIP ARTHROPLASTY ANTERIOR APPROACH;  Surgeon: Mcarthur Rossetti, MD;  Location: [facility];  Service: Orthopedics;  Laterality: Right;   Family History  Problem Relation Age of Onset  . Diabetes Mother   . Goiter Mother   . Lymphoma Mother        Deceased, 58  . Colon polyps Father   . Bladder Cancer Father  Deceased 62  . Addison's disease Sister   . Crohn's disease Son   . Healthy Daughter   . Healthy Brother   . Colon cancer Neg Hx    Social History   Tobacco Use  . Smoking status: Never Smoker  . Smokeless tobacco: Never Used  Substance Use Topics  . Alcohol use: Yes    Alcohol/week: 0.0 standard drinks    Comment: social, red wine 1 glass 3-4 drinks per week  . Drug use: No      Observations/Objective: The patient is alert and answers questions appropriately.  Visible skin is unremarkable.  She is normally conversive without shortness of breath, audible wheeze.  I did witness one episode of dry cough.   Mood and affect are appropriate.  BP 112/68   Pulse 62   Wt 136 lb (61.7 kg)   BMI 23.34 kg/m  Wt Readings from Last 3 Encounters:  01/21/19 136 lb (61.7 kg)  05/26/18 139 lb 6.4 oz (63.2 kg)  05/07/17 133 lb 8 oz (60.6 kg)   Assessment and plan  1. Chronic cough -Most likely post viral, no worrisome symptoms.  Discussed cough suppression and preventing cyclical cough.  If no improvement with these measures in the next 2 weeks she can come in for chest x-ray. - DG Chest 2 View; Future - benzonatate (TESSALON) 100 MG capsule; Take 1-2 capsules (100-200 mg total) by mouth 2 (two) times daily as needed.  Dispense: 30 capsule; Refill: [place], FNP-BC  [facility]  01/21/2019 8:29 AM   Follow Up Instructions: Visit recap sent to patient via my chart   I discussed the assessment and treatment plan with the patient. The patient was provided an opportunity to ask questions and all were answered. The patient agreed with the plan and demonstrated an understanding of the instructions.   The patient was advised to call back or seek an in-person evaluation if the symptoms worsen or if the condition fails to improve as anticipated.   Elby Beck, FNP

## 2019-01-22 LAB — HM MAMMOGRAPHY

## 2019-02-13 ENCOUNTER — Encounter: Payer: Self-pay | Admitting: Family Medicine

## 2019-04-07 ENCOUNTER — Other Ambulatory Visit: Payer: Self-pay

## 2019-04-22 ENCOUNTER — Ambulatory Visit (INDEPENDENT_AMBULATORY_CARE_PROVIDER_SITE_OTHER): Payer: BC Managed Care – PPO

## 2019-04-22 ENCOUNTER — Other Ambulatory Visit: Payer: Self-pay

## 2019-04-22 DIAGNOSIS — Z23 Encounter for immunization: Secondary | ICD-10-CM | POA: Diagnosis not present

## 2019-07-01 ENCOUNTER — Ambulatory Visit (INDEPENDENT_AMBULATORY_CARE_PROVIDER_SITE_OTHER): Payer: BC Managed Care – PPO | Admitting: Family Medicine

## 2019-07-01 ENCOUNTER — Other Ambulatory Visit: Payer: Self-pay

## 2019-07-01 ENCOUNTER — Encounter: Payer: Self-pay | Admitting: Family Medicine

## 2019-07-01 VITALS — BP 122/80 | HR 60 | Temp 97.9°F | Ht 64.0 in | Wt 136.4 lb

## 2019-07-01 DIAGNOSIS — R7989 Other specified abnormal findings of blood chemistry: Secondary | ICD-10-CM | POA: Diagnosis not present

## 2019-07-01 DIAGNOSIS — Z0000 Encounter for general adult medical examination without abnormal findings: Secondary | ICD-10-CM | POA: Diagnosis not present

## 2019-07-01 DIAGNOSIS — I10 Essential (primary) hypertension: Secondary | ICD-10-CM

## 2019-07-01 DIAGNOSIS — Z111 Encounter for screening for respiratory tuberculosis: Secondary | ICD-10-CM | POA: Diagnosis not present

## 2019-07-01 LAB — LIPID PANEL
Cholesterol: 227 mg/dL — ABNORMAL HIGH (ref 0–200)
HDL: 59.6 mg/dL (ref >39.00)
LDL Cholesterol: 143 mg/dL — ABNORMAL HIGH (ref 0–99)
NonHDL: 166.91
Total CHOL/HDL Ratio: 4
Triglycerides: 122 mg/dL (ref 0.0–149.0)
VLDL: 24.4 mg/dL (ref 0.0–40.0)

## 2019-07-01 LAB — COMPREHENSIVE METABOLIC PANEL
ALT: 13 U/L (ref 0–35)
AST: 16 U/L (ref 0–37)
Albumin: 4.9 g/dL (ref 3.5–5.2)
Alkaline Phosphatase: 79 U/L (ref 39–117)
BUN: 11 mg/dL (ref 6–23)
CO2: 30 mEq/L (ref 19–32)
Calcium: 9.9 mg/dL (ref 8.4–10.5)
Chloride: 94 mEq/L — ABNORMAL LOW (ref 96–112)
Creatinine, Ser: 0.61 mg/dL (ref 0.40–1.20)
GFR: 100.6 mL/min (ref >60.00)
Glucose, Bld: 103 mg/dL — ABNORMAL HIGH (ref 70–99)
Potassium: 4.2 mEq/L (ref 3.5–5.1)
Sodium: 131 mEq/L — ABNORMAL LOW (ref 135–145)
Total Bilirubin: 0.6 mg/dL (ref 0.2–1.2)
Total Protein: 7.8 g/dL (ref 6.0–8.3)

## 2019-07-01 LAB — TSH: TSH: 3.49 u[IU]/mL (ref 0.35–4.50)

## 2019-07-01 NOTE — Patient Instructions (Signed)
Good to see you today   Health Maintenance for Postmenopausal Women Menopause is a normal process in which your ability to get pregnant comes to an end. This process happens slowly over many months or years, usually between the ages of 45 and 82. Menopause is complete when you have missed your menstrual periods for 12 months. It is important to talk with your health care provider about some of the most common conditions that affect women after menopause (postmenopausal women). These include heart disease, cancer, and bone loss (osteoporosis). Adopting a healthy lifestyle and getting preventive care can help to promote your health and wellness. The actions you take can also lower your chances of developing some of these common conditions. What should I know about menopause? During menopause, you may get a number of symptoms, such as:  Hot flashes. These can be moderate or severe.  Night sweats.  Decrease in sex drive.  Mood swings.  Headaches.  Tiredness.  Irritability.  Memory problems.  Insomnia. Choosing to treat or not to treat these symptoms is a decision that you make with your health care provider. Do I need hormone replacement therapy?  Hormone replacement therapy is effective in treating symptoms that are caused by menopause, such as hot flashes and night sweats.  Hormone replacement carries certain risks, especially as you become older. If you are thinking about using estrogen or estrogen with progestin, discuss the benefits and risks with your health care provider. What is my risk for heart disease and stroke? The risk of heart disease, heart attack, and stroke increases as you age. One of the causes may be a change in the body's hormones during menopause. This can affect how your body uses dietary fats, triglycerides, and cholesterol. Heart attack and stroke are medical emergencies. There are many things that you can do to help prevent heart disease and stroke. Watch your  blood pressure  High blood pressure causes heart disease and increases the risk of stroke. This is more likely to develop in people who have high blood pressure readings, are of African descent, or are overweight.  Have your blood pressure checked: ? Every 3-5 years if you are 70-61 years of age. ? Every year if you are 65 years old or older. Eat a healthy diet   Eat a diet that includes plenty of vegetables, fruits, low-fat dairy products, and lean protein.  Do not eat a lot of foods that are high in solid fats, added sugars, or sodium. Get regular exercise Get regular exercise. This is one of the most important things you can do for your health. Most adults should:  Try to exercise for at least 150 minutes each week. The exercise should increase your heart rate and make you sweat (moderate-intensity exercise).  Try to do strengthening exercises at least twice each week. Do these in addition to the moderate-intensity exercise.  Spend less time sitting. Even light physical activity can be beneficial. Other tips  Work with your health care provider to achieve or maintain a healthy weight.  Do not use any products that contain nicotine or tobacco, such as cigarettes, e-cigarettes, and chewing tobacco. If you need help quitting, ask your health care provider.  Know your numbers. Ask your health care provider to check your cholesterol and your blood sugar (glucose). Continue to have your blood tested as directed by your health care provider. Do I need screening for cancer? Depending on your health history and family history, you may need to have cancer screening  at different stages of your life. This may include screening for:  Breast cancer.  Cervical cancer.  Lung cancer.  Colorectal cancer. What is my risk for osteoporosis? After menopause, you may be at increased risk for osteoporosis. Osteoporosis is a condition in which bone destruction happens more quickly than new bone  creation. To help prevent osteoporosis or the bone fractures that can happen because of osteoporosis, you may take the following actions:  If you are 35-43 years old, get at least 1,000 mg of calcium and at least 600 mg of vitamin D per day.  If you are older than age 22 but younger than age 72, get at least 1,200 mg of calcium and at least 600 mg of vitamin D per day.  If you are older than age 59, get at least 1,200 mg of calcium and at least 800 mg of vitamin D per day. Smoking and drinking excessive alcohol increase the risk of osteoporosis. Eat foods that are rich in calcium and vitamin D, and do weight-bearing exercises several times each week as directed by your health care provider. How does menopause affect my mental health? Depression may occur at any age, but it is more common as you become older. Common symptoms of depression include:  Low or sad mood.  Changes in sleep patterns.  Changes in appetite or eating patterns.  Feeling an overall lack of motivation or enjoyment of activities that you previously enjoyed.  Frequent crying spells. Talk with your health care provider if you think that you are experiencing depression. General instructions See your health care provider for regular wellness exams and vaccines. This may include:  Scheduling regular health, dental, and eye exams.  Getting and maintaining your vaccines. These include: ? Influenza vaccine. Get this vaccine each year before the flu season begins. ? Pneumonia vaccine. ? Shingles vaccine. ? Tetanus, diphtheria, and pertussis (Tdap) booster vaccine. Your health care provider may also recommend other immunizations. Tell your health care provider if you have ever been abused or do not feel safe at home. Summary  Menopause is a normal process in which your ability to get pregnant comes to an end.  This condition causes hot flashes, night sweats, decreased interest in sex, mood swings, headaches, or lack of  sleep.  Treatment for this condition may include hormone replacement therapy.  Take actions to keep yourself healthy, including exercising regularly, eating a healthy diet, watching your weight, and checking your blood pressure and blood sugar levels.  Get screened for cancer and depression. Make sure that you are up to date with all your vaccines. This information is not intended to replace advice given to you by your health care provider. Make sure you discuss any questions you have with your health care provider. Document Released: 08/24/2005 Document Revised: 06/25/2018 Document Reviewed: 06/25/2018 Elsevier Patient Education  2020 [employer].

## 2019-07-01 NOTE — Progress Notes (Signed)
Subjective:    Patient ID: Sharon Riddle, female    DOB: 07/03/1961, 58 y.o.   MRN: [id number]  HPI Chief Complaint  Patient presents with  . Annual Exam    no new concerns   This is a 58 yo female who presents today for CPE. 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 Recently received a grant to study body mechanics of staff in postoperative phase.   Last CPE- 05/26/2018 Mammo- 01/22/2019 Pap- 2 years ago, has upcoming gyn visit Colonoscopy- 12/07/2016 Tdap- 01/02/2010 Flu- 04/22/2019 Exercise- has been somewhat limited with right ankle and left knee injury.    Past Medical History:  Diagnosis Date  . Arthritis   . Complication of anesthesia    Post anesthesia shivers  . History of kidney stones   . Hypertension    NSAID related  . Loose body of right knee 04/18/2012  . Osteoarthritis of left hip 05/13/2014  . PONV (postoperative nausea and vomiting)    N/V with pain medications  . Vertigo    hx of   Past Surgical History:  Procedure Laterality Date  . COLONOSCOPY    . KNEE ARTHROSCOPY  04/18/2012   Procedure: ARTHROSCOPY KNEE;  Surgeon: Johnny Bridge, MD;  Location: [place];  Service: Orthopedics;  Laterality: Right;  Removal of Loose Bodies-2  . TONSILLECTOMY    . TOTAL HIP ARTHROPLASTY Left 05/13/2014   Procedure: LEFT TOTAL HIP ARTHROPLASTY;  Surgeon: Johnny Bridge, MD;  Location: [place];  Service: Orthopedics;  Laterality: Left;  . TOTAL HIP ARTHROPLASTY Right 09/17/2014   Procedure: RIGHT TOTAL HIP ARTHROPLASTY ANTERIOR APPROACH;  Surgeon: Mcarthur Rossetti, MD;  Location: [facility];  Service: Orthopedics;  Laterality: Right;   Family History  Problem Relation Age of Onset  . Diabetes Mother   . Goiter Mother   . Lymphoma Mother        Deceased, 61  . Colon polyps Father   . Bladder Cancer Father        Deceased 45  . Addison's disease Sister   . Crohn's disease Son   . Healthy Daughter   . Healthy Brother   . Colon  cancer Neg Hx    Social History   Tobacco Use  . Smoking status: Never Smoker  . Smokeless tobacco: Never Used  Substance Use Topics  . Alcohol use: Yes    Alcohol/week: 0.0 standard drinks    Comment: social, red wine 1 glass 3-4 drinks per week  . Drug use: No     Review of Systems  Constitutional: Negative.   HENT: Negative.   Eyes: Negative.   Respiratory: Positive for cough (occasional).   Cardiovascular: Negative.   Endocrine: Negative.   Genitourinary: Negative.   Musculoskeletal:       Two recent orthopedic issues- sprain of left ankle.  Tore ligament of left knee-has been seen by ortho, has hinged brace, swelling improved.   Skin: Negative.   Allergic/Immunologic: Negative.   Neurological: Negative.   Hematological: Negative.        Objective:   Physical Exam Constitutional:      Appearance: Normal appearance.  HENT:     Head: Normocephalic and atraumatic.     Right Ear: Tympanic membrane, ear canal and external ear normal.     Left Ear: Tympanic membrane, ear canal and external ear normal.  Neck:     Vascular: No carotid bruit.  Cardiovascular:     Rate and Rhythm:  Normal rate and regular rhythm.     Pulses: Normal pulses.     Heart sounds: Normal heart sounds.  Pulmonary:     Effort: Pulmonary effort is normal.     Breath sounds: Normal breath sounds.  Chest:     Breasts:        Right: Normal. No swelling, bleeding, inverted nipple, mass, nipple discharge, skin change or tenderness.        Left: Normal. No swelling, bleeding, inverted nipple, mass, nipple discharge, skin change or tenderness.  Musculoskeletal:        General: Swelling (left knee) present. Normal range of motion.     Cervical back: Normal range of motion and neck supple. No rigidity or tenderness.     Right lower leg: No edema.     Left lower leg: No edema.  Lymphadenopathy:     Cervical: No cervical adenopathy.     Upper Body:     Right upper body: No supraclavicular, axillary  or pectoral adenopathy.     Left upper body: No supraclavicular, axillary or pectoral adenopathy.  Skin:    General: Skin is warm and dry.  Neurological:     Mental Status: She is alert and oriented to person, place, and time.  Psychiatric:        Mood and Affect: Mood normal.        Behavior: Behavior normal.        Thought Content: Thought content normal.        Judgment: Judgment normal.      BP 122/80 (BP Location: Left Arm, Patient Position: Sitting, Cuff Size: Normal)   Pulse 60   Temp 97.9 F (36.6 C) (Temporal)   Ht 5\' 4"  (1.626 m)   Wt 136 lb 6.4 oz (61.9 kg)   SpO2 99%   BMI 23.41 kg/m  Wt Readings from Last 3 Encounters:  07/01/19 136 lb 6.4 oz (61.9 kg)  01/21/19 136 lb (61.7 kg)  05/26/18 139 lb 6.4 oz (63.2 kg)        Assessment & Plan:  1. Annual physical exam - Discussed and encouraged healthy lifestyle choices- adequate sleep, regular exercise, stress management and healthy food choices.    2. Essential hypertension - well controlled on current medication - Lipid Panel - Comprehensive metabolic panel  3. Screening for tuberculosis - QuantiFERON-TB Gold Plus  4. Abnormal thyroid blood test - TSH  This visit occurred during the SARS-CoV-2 public health emergency.  Safety protocols were in place, including screening questions prior to the visit, additional usage of staff PPE, and extensive cleaning of exam room while observing appropriate contact time as indicated for disinfecting solutions.    Clarene Reamer, FNP-BC  [facility]  07/01/2019 12:37 PM

## 2019-07-03 LAB — QUANTIFERON-TB GOLD PLUS
Mitogen-NIL: >10 IU/mL
NIL: 0.04 IU/mL
QuantiFERON-TB Gold Plus: NEGATIVE
TB1-NIL: 0.03 IU/mL
TB2-NIL: 0.05 IU/mL

## 2019-07-05 ENCOUNTER — Other Ambulatory Visit: Payer: Self-pay | Admitting: Family Medicine

## 2019-07-05 DIAGNOSIS — I10 Essential (primary) hypertension: Secondary | ICD-10-CM

## 2019-07-06 ENCOUNTER — Other Ambulatory Visit: Payer: Self-pay | Admitting: Family Medicine

## 2019-07-06 ENCOUNTER — Encounter: Payer: Self-pay | Admitting: Family Medicine

## 2019-07-06 DIAGNOSIS — I10 Essential (primary) hypertension: Secondary | ICD-10-CM

## 2019-07-06 MED ORDER — VALSARTAN 80 MG PO TABS: 80.0000 mg | ORAL_TABLET | Freq: Every day | ORAL | 3 refills | Status: DC

## 2019-07-23 ENCOUNTER — Encounter: Payer: Self-pay | Admitting: Family Medicine

## 2019-07-24 ENCOUNTER — Other Ambulatory Visit: Payer: Self-pay | Admitting: Family Medicine

## 2019-07-24 DIAGNOSIS — I10 Essential (primary) hypertension: Secondary | ICD-10-CM

## 2019-07-24 MED ORDER — VALSARTAN 160 MG PO TABS: 160.0000 mg | ORAL_TABLET | Freq: Every day | ORAL | 3 refills | Status: DC

## 2019-08-27 ENCOUNTER — Other Ambulatory Visit: Payer: Self-pay | Admitting: Family Medicine

## 2019-08-27 ENCOUNTER — Encounter: Payer: Self-pay | Admitting: Family Medicine

## 2019-08-27 DIAGNOSIS — I10 Essential (primary) hypertension: Secondary | ICD-10-CM

## 2019-08-28 ENCOUNTER — Other Ambulatory Visit: Payer: Self-pay | Admitting: Family Medicine

## 2019-08-28 DIAGNOSIS — I10 Essential (primary) hypertension: Secondary | ICD-10-CM

## 2019-08-28 MED ORDER — HYDROCHLOROTHIAZIDE 12.5 MG PO CAPS: 12.5000 mg | ORAL_CAPSULE | Freq: Every day | ORAL | 3 refills | Status: DC

## 2020-01-20 ENCOUNTER — Encounter: Payer: Self-pay | Admitting: Family Medicine

## 2020-01-20 ENCOUNTER — Other Ambulatory Visit: Payer: Self-pay

## 2020-01-20 ENCOUNTER — Other Ambulatory Visit (INDEPENDENT_AMBULATORY_CARE_PROVIDER_SITE_OTHER): Payer: BC Managed Care – PPO

## 2020-01-20 DIAGNOSIS — I10 Essential (primary) hypertension: Secondary | ICD-10-CM

## 2020-01-20 LAB — BASIC METABOLIC PANEL
BUN: 10 mg/dL (ref 6–23)
CO2: 28 mEq/L (ref 19–32)
Calcium: 9.4 mg/dL (ref 8.4–10.5)
Chloride: 94 mEq/L — ABNORMAL LOW (ref 96–112)
Creatinine, Ser: 0.62 mg/dL (ref 0.40–1.20)
GFR: 98.54 mL/min (ref >60.00)
Glucose, Bld: 100 mg/dL — ABNORMAL HIGH (ref 70–99)
Potassium: 4.1 mEq/L (ref 3.5–5.1)
Sodium: 129 mEq/L — ABNORMAL LOW (ref 135–145)

## 2020-01-28 LAB — HM MAMMOGRAPHY

## 2020-02-11 ENCOUNTER — Encounter: Payer: Self-pay | Admitting: Family Medicine

## 2020-05-07 ENCOUNTER — Ambulatory Visit (INDEPENDENT_AMBULATORY_CARE_PROVIDER_SITE_OTHER): Payer: BC Managed Care – PPO

## 2020-05-07 DIAGNOSIS — Z23 Encounter for immunization: Secondary | ICD-10-CM

## 2020-05-09 ENCOUNTER — Other Ambulatory Visit: Payer: Self-pay

## 2020-07-21 ENCOUNTER — Telehealth: Payer: Self-pay

## 2020-07-21 DIAGNOSIS — I10 Essential (primary) hypertension: Secondary | ICD-10-CM

## 2020-07-21 MED ORDER — VALSARTAN 160 MG PO TABS: 160.0000 mg | ORAL_TABLET | Freq: Every day | ORAL | 0 refills | Status: DC

## 2020-07-21 NOTE — Telephone Encounter (Signed)
Pharmacy requests refill on: Valsartan 160 mg   LAST REFILL: 07/24/2019 LAST OV: 07/01/2019 NEXT OV: Not Scheduled  PHARMACY: [id number] [place], [place]

## 2020-07-25 NOTE — Telephone Encounter (Signed)
I left a message on patient's voice mail to call back and schedule transfer of care.

## 2020-08-11 ENCOUNTER — Ambulatory Visit (INDEPENDENT_AMBULATORY_CARE_PROVIDER_SITE_OTHER)
Admission: RE | Admit: 2020-08-11 | Discharge: 2020-08-11 | Disposition: A | Discharge status: Home / Self Care | Payer: BC Managed Care – PPO | Source: Ambulatory Visit | Attending: Family Medicine | Admitting: Family Medicine

## 2020-08-11 ENCOUNTER — Encounter: Payer: Self-pay | Admitting: Family Medicine

## 2020-08-11 ENCOUNTER — Other Ambulatory Visit: Payer: Self-pay

## 2020-08-11 ENCOUNTER — Ambulatory Visit: Payer: BC Managed Care – PPO | Admitting: Family Medicine

## 2020-08-11 VITALS — BP 120/64 | HR 61 | Temp 97.8°F | Ht 64.0 in | Wt 135.0 lb

## 2020-08-11 DIAGNOSIS — M25561 Pain in right knee: Secondary | ICD-10-CM

## 2020-08-11 DIAGNOSIS — M2341 Loose body in knee, right knee: Secondary | ICD-10-CM | POA: Diagnosis not present

## 2020-08-11 DIAGNOSIS — M1711 Unilateral primary osteoarthritis, right knee: Secondary | ICD-10-CM | POA: Diagnosis not present

## 2020-08-11 DIAGNOSIS — M2480 Other specific joint derangements of unspecified joint, not elsewhere classified: Secondary | ICD-10-CM

## 2020-08-11 NOTE — Progress Notes (Signed)
Sharon Bihm T. [name], MD, [place] [place], [address]  Phone: [phone]  FAX: [place] - 60 y.o. female  MRN [id number]  Date of Birth: [birth year]  Date: 08/11/2020  PCP: Sharon Beck, FNP (Inactive)  Referral: No ref. provider found  Chief Complaint  Patient presents with  . Knee Pain    Right    This visit occurred during the SARS-CoV-2 public health emergency.  Safety protocols were in place, including screening questions prior to the visit, additional usage of staff PPE, and extensive cleaning of exam room while observing appropriate contact time as indicated for disinfecting solutions.   Subjective:   Sharon Riddle is a 60 y.o. very pleasant female patient with Body mass index is 23.17 kg/m. who presents with the following:  2013, 2 large loose bodies removed by Dr. Mardelle Matte  She is currently having some very severe pain and a mechanical block with loss of motion in the right knee.  She has very significant loss of extension.  She does not recall any form of a specific injury.  At baseline she is very active and works out all the time.  Recently while on vacation she did an extended hiking trip without any kind of difficulty.  This happened in an abrupt format without specific injury approximately 3 weeks ago.  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  Globally she is healthy, and the only regular medication that she takes is Diovan.  She has not had a significant effusion.  Patient Active Problem List   Diagnosis Date Noted  . 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 Spina bifida occulta 07/23/2013  . HTN (hypertension), benign 01/01/2012  . VERTIGO, INTERMITTENT 03/24/2008    Past Medical History:  Diagnosis Date  . Arthritis   . Complication of anesthesia    Post anesthesia shivers  . History of kidney  stones   . Hypertension    NSAID related  . Loose body of right knee 04/18/2012  . Osteoarthritis of left hip 05/13/2014  . PONV (postoperative nausea and vomiting)    N/V with pain medications  . Vertigo    hx of    Past Surgical History:  Procedure Laterality Date  . COLONOSCOPY    . KNEE ARTHROSCOPY  04/18/2012   Procedure: ARTHROSCOPY KNEE;  Surgeon: Johnny Bridge, MD;  Location: [facility];  Service: Orthopedics;  Laterality: Right;  Removal of Loose Bodies-2  . TONSILLECTOMY    . TOTAL HIP ARTHROPLASTY Left 05/13/2014   Procedure: LEFT TOTAL HIP ARTHROPLASTY;  Surgeon: Johnny Bridge, MD;  Location: [facility];  Service: Orthopedics;  Laterality: Left;  . TOTAL HIP ARTHROPLASTY Right 09/17/2014   Procedure: RIGHT TOTAL HIP ARTHROPLASTY ANTERIOR APPROACH;  Surgeon: Mcarthur Rossetti, MD;  Location: [facility];  Service: Orthopedics;  Laterality: Right;    Family History  Problem Relation Age of Onset  . Diabetes Mother   . Goiter Mother   . Lymphoma Mother        Deceased, 16  . Colon polyps Father   . Bladder Cancer Father        Deceased 6  . Addison's disease Sister   . Crohn's disease Son   . Healthy Daughter   . Healthy Brother   . Colon cancer Neg Hx      Review of Systems  is noted in the HPI, as appropriate   Objective:   BP 120/64   Pulse 61   Temp 97.8 F (36.6 C) (Temporal)   Ht 5\' 4"  (1.626 m)   Wt 135 lb (61.2 kg)   SpO2 98%   BMI 23.17 kg/m   GEN: No acute distress; alert,appropriate. PULM: Breathing comfortably in no respiratory distress PSYCH: Normally interactive.    Right knee: The patient has minimal to no effusion.  She lacks approximately 35 degrees of extension and any attempt to move beyond this causes severe pain.  She also is only able to flex her knee to approximately 95 degrees, and additional movement causes some severe pain.  Very notable pain.  MCL and LCL.  Appear intact on exam.  Fibular head and tibial  plateau are grossly nontender.  Distal tibia and fibula are nontender all the way to the ankle.  She has no tenderness with abduction or rotational movements at the hip.  Additional examination of the knee is limited secondary to acute mechanical block and pain.  Radiology: *RADIOLOGY REPORT*   Clinical Data: Right knee pain.   MRI OF THE RIGHT KNEE WITHOUT CONTRAST   Technique: Multiplanar, multisequence MR imaging of the right knee  was performed. No intravenous contrast was administered.   Comparison: Plain films 03/24/2012.   FINDINGS:  MENISCI  Medial: Intact.  Lateral: Intact.   LIGAMENTS  Cruciates: Intact.  Collaterals: Intact.   CARTILAGE  Patellofemoral: Marked hyaline cartilage loss is present,  particularly about the lateral patellar facet and lateral femoral  trochlea.  Medial: Mildly degenerated.  Lateral: Mildly to moderately degenerated.   Joint: Small joint effusion is noted.  Popliteal Fossa: There is a very small Baker's cyst.  Extensor Mechanism: Intact.  Bones: Intense marrow edema is seen subjacent the lateral tibial  eminence extending subjacent to the medial tibial eminence and into  the lateral tibial plateau. A small subchondral cyst is seen  subjacent to the lateral tibial eminence. No fracture is  identified. Bulky tricompartmental osteophytes are present. There  is a large loose body immediately posterior to the PCL measuring  1.7 cm in diameter. A second loose body posterior to the medial  femoral condyle measures 1.7 cm in diameter.   IMPRESSION:   1. Intense marrow edema subjacent to the lateral tibial eminence  and extending into the lateral tibial plateau may be due to stress  change. No discrete fracture is identified.  2. Tricompartmental osteoarthritis with bulky osteophytes present  about the knee and two large loose bodies in the posterior aspect  of the joint.  3. Negative for meniscal or ligament  tear.    Original Report Authenticated By: Arvid Right. Luther Parody, M.D.    CLINICAL DATA:  Right knee pain.  EXAM: RIGHT KNEE - COMPLETE 4+ VIEW  COMPARISON:  None.  FINDINGS: No evidence of fracture or dislocation. A small knee joint effusion is seen. Mild tricompartmental degenerative spurring is seen. Lateral patellar joint space narrowing noted. No other osseous abnormality identified.  IMPRESSION: No acute findings.  Mild tricompartmental osteoarthritis and small knee joint effusion.   Electronically Signed   By: Marlaine Hind M.D.   On: 08/11/2020 16:21  Assessment and Plan:     ICD-10-CM   1. Joint locking  M24.80 MR Knee Right Wo Contrast  2. Acute pain of right knee  M25.561 DG Knee 4 Views W/Patella Right    MR Knee Right Wo Contrast  3. Loose body in knee, right knee  M23.41 MR Knee Right Wo Contrast  4. Primary osteoarthritis of right knee  M17.11 MR Knee Right Wo Contrast   On my examination of the plain radiographs, there does appear to be a loose body in the knee.  So she has a severe mechanical block in the knee, and severe loss of motion with extension.  She lacks approximately 35 degrees of extension.  Flexion is also limited.  Severe pain with movement beyond this.  Additional etiology on differential would include a very large meniscal tear locked in the knee joint.  The patient has a severe mechanical block in the knee with at this point dramatic loss of motion and a severe inability to extend the knee with additional inability to flex the knee and notable limp.  This has not altered over the last few weeks, and at this point she is significantly impaired even with her day-to-day activities.  Obtain an MRI of the right knee to evaluate for etiology above, advanced imaging to assess for probable surgical management.  Orders Placed This Encounter  Procedures  . DG Knee 4 Views W/Patella Right  . MR Knee Right Wo Contrast     Signed,  Sharon Riddle  T. Shakyla Nolley, MD   Outpatient Encounter Medications as of 08/11/2020  Medication Sig  . calcium carbonate (TUMS - DOSED IN MG ELEMENTAL CALCIUM) 500 MG chewable tablet Chew 1 tablet by mouth 2 (two) times daily.  . Cholecalciferol (VITAMIN D-3) 1000 UNITS CAPS Take 1 capsule by mouth 2 (two) times daily.  . Misc Natural Products (GLUCOSAMINE CHOND CMP DOUBLE PO) Take 2 tablets by mouth at bedtime.  . valsartan (DIOVAN) 160 MG tablet Take 1 tablet (160 mg total) by mouth daily.  . [DISCONTINUED] benzonatate (TESSALON) 100 MG capsule Take 1-2 capsules (100-200 mg total) by mouth 2 (two) times daily as needed.  . [DISCONTINUED] hydrochlorothiazide (MICROZIDE) 12.5 MG capsule Take 1 capsule (12.5 mg total) by mouth daily.  . [DISCONTINUED] Pediatric Multiple Vitamins (FLINTSTONES MULTIVITAMIN PO) Take 1 tablet by mouth daily.   No facility-administered encounter medications on file as of 08/11/2020.

## 2020-08-15 ENCOUNTER — Ambulatory Visit: Payer: BC Managed Care – PPO | Admitting: Family Medicine

## 2020-08-30 ENCOUNTER — Ambulatory Visit
Admission: RE | Admit: 2020-08-30 | Discharge: 2020-08-30 | Disposition: A | Discharge status: Home / Self Care | Payer: BC Managed Care – PPO | Source: Ambulatory Visit | Attending: Family Medicine | Admitting: Family Medicine

## 2020-08-30 ENCOUNTER — Other Ambulatory Visit: Payer: Self-pay

## 2020-08-30 DIAGNOSIS — M1711 Unilateral primary osteoarthritis, right knee: Secondary | ICD-10-CM

## 2020-08-30 DIAGNOSIS — M2341 Loose body in knee, right knee: Secondary | ICD-10-CM

## 2020-08-30 DIAGNOSIS — M2480 Other specific joint derangements of unspecified joint, not elsewhere classified: Secondary | ICD-10-CM

## 2020-08-30 DIAGNOSIS — M25561 Pain in right knee: Secondary | ICD-10-CM

## 2020-09-21 ENCOUNTER — Ambulatory Visit (INDEPENDENT_AMBULATORY_CARE_PROVIDER_SITE_OTHER): Payer: BC Managed Care – PPO | Admitting: Internal Medicine

## 2020-09-21 ENCOUNTER — Other Ambulatory Visit: Payer: Self-pay

## 2020-09-21 ENCOUNTER — Encounter: Payer: Self-pay | Admitting: Internal Medicine

## 2020-09-21 VITALS — BP 124/76 | HR 75 | Temp 97.5°F | Ht 63.5 in | Wt 136.0 lb

## 2020-09-21 DIAGNOSIS — I10 Essential (primary) hypertension: Secondary | ICD-10-CM

## 2020-09-21 DIAGNOSIS — Z23 Encounter for immunization: Secondary | ICD-10-CM

## 2020-09-21 DIAGNOSIS — Z0001 Encounter for general adult medical examination with abnormal findings: Secondary | ICD-10-CM

## 2020-09-21 DIAGNOSIS — Z111 Encounter for screening for respiratory tuberculosis: Secondary | ICD-10-CM | POA: Diagnosis not present

## 2020-09-21 DIAGNOSIS — M1711 Unilateral primary osteoarthritis, right knee: Secondary | ICD-10-CM | POA: Insufficient documentation

## 2020-09-21 NOTE — Addendum Note (Signed)
Addended by: Lurlean Nanny on: 09/21/2020 05:18 PM   Modules accepted: Orders

## 2020-09-21 NOTE — Assessment & Plan Note (Signed)
Following with Dr. Lorelei Pont Plans to have knee replacement in the future

## 2020-09-21 NOTE — Progress Notes (Signed)
Subjective:    Patient ID: Sharon Riddle, female    DOB: July 02, 1961, 60 y.o.   MRN: [id number]  HPI  Pt presents to the clinic today for her annual exam.   HTN: Her BP today is 124/76. She is taking Valsartan as prescribed. ECG from reviewed.  OA: Mainly in her hips, right knee, s/p bilateral hip replacements.  Flu: 04/2020 Tetanus: 12/2009 Shingrix: 05/2018, 11/2018 Covid: Moderna x 3 Pap Smear: 2022 Mammogram: 01/2020 Bone Density: < 5 years ago Colon Screening: 11/2014 Vision Screening: annually Dentist: biannually  Diet: She has started to eat meat. She consumes fruits and veggies daily. She tries to avoid fried foods. She drinks mostly water. Exercise: Gym 4-5 days per week, core, burn boot camp, cardio  Review of Systems      Past Medical History:  Diagnosis Date  . Arthritis   . Complication of anesthesia    Post anesthesia shivers  . History of kidney stones   . Hypertension    NSAID related  . Loose body of right knee 04/18/2012  . Osteoarthritis of left hip 05/13/2014  . PONV (postoperative nausea and vomiting)    N/V with pain medications  . Vertigo    hx of    Current Outpatient Medications  Medication Sig Dispense Refill  . calcium carbonate (TUMS - DOSED IN MG ELEMENTAL CALCIUM) 500 MG chewable tablet Chew 1 tablet by mouth 2 (two) times daily.    . Cholecalciferol (VITAMIN D-3) 1000 UNITS CAPS Take 1 capsule by mouth 2 (two) times daily.    . Misc Natural Products (GLUCOSAMINE CHOND CMP DOUBLE PO) Take 2 tablets by mouth at bedtime.    . valsartan (DIOVAN) 160 MG tablet Take 1 tablet (160 mg total) by mouth daily. 90 tablet 0   No current facility-administered medications for this visit.    Allergies  Allergen Reactions  . Other Nausea And Vomiting and Other (See Comments)    Pain medications and muscle relaxers, can be given with Zofran  . Tramadol Other (See Comments)    Passed out  . Nsaids     GI upset, elevates blood pressure--can take  toradol   . Pennsaid [Diclofenac Sodium]     Elevates blood pressure  . Scopace [Scopolamine] Other (See Comments)    Massive severe headache for a week  . Sulfonamide Derivatives     REACTION: rash    Family History  Problem Relation Age of Onset  . Diabetes Mother   . Goiter Mother   . Lymphoma Mother        Deceased, 103  . Colon polyps Father   . Bladder Cancer Father        Deceased 54  . Addison's disease Sister   . Crohn's disease Son   . Healthy Daughter   . Healthy Brother   . Colon cancer Neg Hx     Social History   Socioeconomic History  . Marital status: Married    Spouse name: Not on file  . Number of children: 2  . Years of education: Not on file  . Highest education level: Not on file  Occupational History  . Occupation: Radiographer, therapeutic)  Tobacco Use  . Smoking status: Never Smoker  . Smokeless tobacco: Never Used  Substance and Sexual Activity  . Alcohol use: Yes    Alcohol/week: 0.0 standard drinks    Comment: social, red wine 1 glass 3-4 drinks per week  . Drug use: No  . Sexual activity: Not  on file  Other Topics Concern  . Not on file  Social History Narrative   She lives with husband.   Bedroom is on the first floor.     Education: Phd in nursing.  Has 2 grown children.   Social Determinants of Health   Financial Resource Strain: Not on file  Food Insecurity: Not on file  Transportation Needs: Not on file  Physical Activity: Not on file  Stress: Not on file  Social Connections: Not on file  Intimate Partner Violence: Not on file     Constitutional: Denies fever, malaise, fatigue, headache or abrupt weight changes.  HEENT: Denies eye pain, eye redness, ear pain, ringing in the ears, wax buildup, runny nose, nasal congestion, bloody nose, or sore throat. Respiratory: Denies difficulty breathing, shortness of breath, cough or sputum production.   Cardiovascular: Denies chest pain, chest tightness, palpitations or swelling in the  hands or feet.  Gastrointestinal: Denies abdominal pain, bloating, constipation, diarrhea or blood in the stool.  GU: Denies urgency, frequency, pain with urination, burning sensation, blood in urine, odor or discharge. Musculoskeletal: Pt reports intermittent right knee pain. Denies decrease in range of motion, difficulty with gait, muscle pain or joint  swelling.  Skin: Denies redness, rashes, lesions or ulcercations.  Neurological: Denies dizziness, difficulty with memory, difficulty with speech or problems with balance and coordination.  Psych: Denies anxiety, depression, SI/HI.  No other specific complaints in a complete review of systems (except as listed in HPI above).  Objective:   Physical Exam  BP 124/76   Pulse 75   Temp (!) 97.5 F (36.4 C) (Temporal)   Ht 5' 3.5" (1.613 m)   Wt 136 lb (61.7 kg)   SpO2 98%   BMI 23.71 kg/m   Wt Readings from Last 3 Encounters:  08/11/20 135 lb (61.2 kg)  07/01/19 136 lb 6.4 oz (61.9 kg)  01/21/19 136 lb (61.7 kg)    General: Appears her stated age, well developed, well nourished in NAD. Skin: Warm, dry and intact. No rashes noted. HEENT: Head: normal shape and size; Eyes: sclera white, no icterus, conjunctiva pink, PERRLA and EOMs intact;  Neck:  Neck supple, trachea midline. No masses, lumps or thyromegaly present.  Cardiovascular: Normal rate and rhythm. S1,S2 noted.  No murmur, rubs or gallops noted. No JVD or BLE edema. No carotid bruits noted. Pulmonary/Chest: Normal effort and positive vesicular breath sounds. No respiratory distress. No wheezes, rales or ronchi noted.  Abdomen: Soft and nontender. Normal bowel sounds. No distention or masses noted. Liver, spleen and kidneys non palpable. Musculoskeletal: Strength 5/5 BUE/BLE.No difficulty with gait.  Neurological: Alert and oriented. Cranial nerves II-XII grossly intact. Coordination normal.  Psychiatric: Mood and affect normal. Behavior is normal. Judgment and thought content  normal.    BMET    Component Value Date/Time   NA 129 (L) 01/20/2020 1041   K 4.1 01/20/2020 1041   CL 94 (L) 01/20/2020 1041   CO2 28 01/20/2020 1041   GLUCOSE 100 (H) 01/20/2020 1041   BUN 10 01/20/2020 1041   CREATININE 0.62 01/20/2020 1041   CALCIUM 9.4 01/20/2020 1041   GFRNONAA >90 09/18/2014 0426   GFRAA >90 09/18/2014 0426    Lipid Panel     Component Value Date/Time   CHOL 227 (H) 07/01/2019 1226   TRIG 122.0 07/01/2019 1226   HDL 59.60 07/01/2019 1226   CHOLHDL 4 07/01/2019 1226   VLDL 24.4 07/01/2019 1226   LDLCALC 143 (H) 07/01/2019 1226  CBC    Component Value Date/Time   WBC 6.2 05/01/2017 1733   RBC 4.47 05/01/2017 1733   HGB 13.5 05/01/2017 1733   HCT 40.4 05/01/2017 1733   PLT 302.0 05/01/2017 1733   MCV 90.4 05/01/2017 1733   MCH 29.1 09/18/2014 0426   MCHC 33.4 05/01/2017 1733   RDW 13.9 05/01/2017 1733   LYMPHSABS 2.5 05/01/2017 1733   MONOABS 0.5 05/01/2017 1733   EOSABS 0.4 05/01/2017 1733   BASOSABS 0.2 (H) 05/01/2017 1733    Hgb A1C No results found for: HGBA1C         Assessment & Plan:   Preventative Health Maintenance:  Flu shot UTD Tdap today Covid vaccine UTD Shingrix UTD Pap smear UTD Mammogram UTD Bone density UTD per her report Colon screening UTD Encouraged him to consume a balanced diet and exercise regimen Advised her to see an eye doctor and dentist annually Will check CBC, CMET, TSH, Lipid and Quantiferon TB Gold Assay  RTC in 1 year, sooner if needed  Webb Silversmith, NP This visit occurred during the SARS-CoV-2 public health emergency.  Safety protocols were in place, including screening questions prior to the visit, additional usage of staff PPE, and extensive cleaning of exam room while observing appropriate contact time as indicated for disinfecting solutions.

## 2020-09-21 NOTE — Assessment & Plan Note (Signed)
Controlled on Valsartan CMET today

## 2020-09-21 NOTE — Patient Instructions (Signed)

## 2020-09-22 LAB — LIPID PANEL
Cholesterol: 180 mg/dL (ref 0–200)
HDL: 55.9 mg/dL (ref >39.00)
LDL Cholesterol: 104 mg/dL — ABNORMAL HIGH (ref 0–99)
NonHDL: 124.31
Total CHOL/HDL Ratio: 3
Triglycerides: 102 mg/dL (ref 0.0–149.0)
VLDL: 20.4 mg/dL (ref 0.0–40.0)

## 2020-09-22 LAB — COMPREHENSIVE METABOLIC PANEL
ALT: 13 U/L (ref 0–35)
AST: 14 U/L (ref 0–37)
Albumin: 4.4 g/dL (ref 3.5–5.2)
Alkaline Phosphatase: 78 U/L (ref 39–117)
BUN: 13 mg/dL (ref 6–23)
CO2: 30 mEq/L (ref 19–32)
Calcium: 9.8 mg/dL (ref 8.4–10.5)
Chloride: 102 mEq/L (ref 96–112)
Creatinine, Ser: 0.7 mg/dL (ref 0.40–1.20)
GFR: 94.53 mL/min (ref >60.00)
Glucose, Bld: 87 mg/dL (ref 70–99)
Potassium: 4.3 mEq/L (ref 3.5–5.1)
Sodium: 138 mEq/L (ref 135–145)
Total Bilirubin: 0.5 mg/dL (ref 0.2–1.2)
Total Protein: 6.9 g/dL (ref 6.0–8.3)

## 2020-09-22 LAB — CBC
HCT: 39.6 % (ref 36.0–46.0)
Hemoglobin: 13.5 g/dL (ref 12.0–15.0)
MCHC: 34 g/dL (ref 30.0–36.0)
MCV: 88.6 fl (ref 78.0–100.0)
Platelets: 274 10*3/uL (ref 150.0–400.0)
RBC: 4.47 Mil/uL (ref 3.87–5.11)
RDW: 13.9 % (ref 11.5–15.5)
WBC: 6.1 10*3/uL (ref 4.0–10.5)

## 2020-09-22 LAB — TSH: TSH: 3.3 u[IU]/mL (ref 0.35–4.50)

## 2020-10-18 ENCOUNTER — Other Ambulatory Visit: Payer: Self-pay | Admitting: Family Medicine

## 2020-10-18 DIAGNOSIS — I10 Essential (primary) hypertension: Secondary | ICD-10-CM

## 2021-02-06 ENCOUNTER — Telehealth: Payer: Self-pay

## 2021-02-06 NOTE — Telephone Encounter (Signed)
Spoke with patient today, patient will TOC to [place] when she starts in November 2022. Patient was advised to give [place] a call around that time to schedule if she can.

## 2021-04-12 ENCOUNTER — Other Ambulatory Visit: Payer: Self-pay | Admitting: Family Medicine

## 2021-04-12 DIAGNOSIS — I10 Essential (primary) hypertension: Secondary | ICD-10-CM

## 2021-05-22 ENCOUNTER — Telehealth: Payer: Self-pay

## 2021-05-22 NOTE — Telephone Encounter (Signed)
Pt's husband called in that pt has had URI symptoms: sore throat, productive cough, and right ear pain. Pt's ear was evaluated and she has otitis media. Augmentin [id number] bid for 10 days #20 sent to [facility].

## 2021-07-18 ENCOUNTER — Other Ambulatory Visit: Payer: Self-pay

## 2021-07-18 ENCOUNTER — Ambulatory Visit: Payer: BC Managed Care – PPO | Admitting: Family

## 2021-07-18 ENCOUNTER — Encounter: Payer: Self-pay | Admitting: Family

## 2021-07-18 VITALS — BP 132/96 | HR 65 | Temp 97.6°F | Ht 63.5 in | Wt 136.0 lb

## 2021-07-18 DIAGNOSIS — E785 Hyperlipidemia, unspecified: Secondary | ICD-10-CM | POA: Diagnosis not present

## 2021-07-18 DIAGNOSIS — R7989 Other specified abnormal findings of blood chemistry: Secondary | ICD-10-CM | POA: Diagnosis not present

## 2021-07-18 DIAGNOSIS — M1711 Unilateral primary osteoarthritis, right knee: Secondary | ICD-10-CM

## 2021-07-18 DIAGNOSIS — E871 Hypo-osmolality and hyponatremia: Secondary | ICD-10-CM | POA: Diagnosis not present

## 2021-07-18 DIAGNOSIS — I10 Essential (primary) hypertension: Secondary | ICD-10-CM | POA: Diagnosis not present

## 2021-07-18 LAB — COMPREHENSIVE METABOLIC PANEL
ALT: 13 U/L (ref 0–35)
AST: 16 U/L (ref 0–37)
Albumin: 4.4 g/dL (ref 3.5–5.2)
Alkaline Phosphatase: 72 U/L (ref 39–117)
BUN: 12 mg/dL (ref 6–23)
CO2: 28 mEq/L (ref 19–32)
Calcium: 9.5 mg/dL (ref 8.4–10.5)
Chloride: 100 mEq/L (ref 96–112)
Creatinine, Ser: 0.63 mg/dL (ref 0.40–1.20)
GFR: 96.41 mL/min (ref >60.00)
Glucose, Bld: 90 mg/dL (ref 70–99)
Potassium: 4.5 mEq/L (ref 3.5–5.1)
Sodium: 136 mEq/L (ref 135–145)
Total Bilirubin: 0.7 mg/dL (ref 0.2–1.2)
Total Protein: 7.1 g/dL (ref 6.0–8.3)

## 2021-07-18 LAB — LIPID PANEL
Cholesterol: 229 mg/dL — ABNORMAL HIGH (ref 0–200)
HDL: 59.3 mg/dL (ref >39.00)
LDL Cholesterol: 146 mg/dL — ABNORMAL HIGH (ref 0–99)
NonHDL: 169.45
Total CHOL/HDL Ratio: 4
Triglycerides: 116 mg/dL (ref 0.0–149.0)
VLDL: 23.2 mg/dL (ref 0.0–40.0)

## 2021-07-18 LAB — TSH: TSH: 2.86 u[IU]/mL (ref 0.35–5.50)

## 2021-07-18 NOTE — Assessment & Plan Note (Signed)
Will order TSH today pending results.

## 2021-07-18 NOTE — Assessment & Plan Note (Signed)
Continue daily valsartan as prescribed maintain a low-sodium diet, continue to monitor blood pressure periodically and as needed.

## 2021-07-18 NOTE — Assessment & Plan Note (Signed)
Lab draw today pending results

## 2021-07-18 NOTE — Patient Instructions (Signed)
Stop by the lab prior to leaving today. I will notify you of your results once received.   It was a pleasure seeing you today! Please do not hesitate to reach out with any questions and or concerns.  Regards,   Eugenia Pancoast FNP-C

## 2021-07-18 NOTE — Assessment & Plan Note (Signed)
Continue with symptom management as necessary.  Exercise as tolerated

## 2021-07-18 NOTE — Progress Notes (Signed)
Established Patient Office Visit  Subjective:  Patient ID: Sharon Riddle, female    DOB: Jun 30, 1961  Age: 61 y.o. MRN: [id number]  CC:  Chief Complaint  Patient presents with   Transitions Of Care    HPI Sharon Riddle is here today for a transition of care visit.  Patient was previously seeing Webb Silversmith, NP.  Pt is without acute concerns.   Thyroid abnormal test in the past : denies fatigue, no weight change concerns, cold intolerance (but has experienced this her whole life), does have dry skin, and frizzy thin hair but not abnormal for her.   Hyponatremia: resolved as of last lab draw, however pt has had chronic hyponatremia so wants to continue to monitor.   Chronic problems addressed today:  Prehypertension developed because of early menopause, has had since.   HTN: controlled on valsartan 160 mg po qd. seems to sit around 105/85. Valsartan 160 mg po qd, denies cp palp and or sob. Very active, exercises regularly , 6 x a week at the [employer] [facility] [employer] [facility] boot camp 2-3 times a week, and walks the other days as well as core work. Healthy diet as well.   Past Medical History:  Diagnosis Date   Arthritis    Complication of anesthesia    Post anesthesia shivers   History of kidney stones    Hypertension    NSAID related   Loose body of right knee 04/18/2012   Osteoarthritis of left hip 05/13/2014   PONV (postoperative nausea and vomiting)    N/V with pain medications   Spina bifida occulta 07/23/2013   This is at L5 level   Vertigo    hx of    Past Surgical History:  Procedure Laterality Date   COLONOSCOPY     KNEE ARTHROSCOPY  04/18/2012   Procedure: ARTHROSCOPY KNEE;  Surgeon: Johnny Bridge, MD;  Location: [facility];  Service: Orthopedics;  Laterality: Right;  Removal of Loose Bodies-2   TONSILLECTOMY     TOTAL HIP ARTHROPLASTY Left 05/13/2014   Procedure: LEFT TOTAL HIP ARTHROPLASTY;  Surgeon: Johnny Bridge, MD;  Location: [facility];  Service: Orthopedics;   Laterality: Left;   TOTAL HIP ARTHROPLASTY Right 09/17/2014   Procedure: RIGHT TOTAL HIP ARTHROPLASTY ANTERIOR APPROACH;  Surgeon: Mcarthur Rossetti, MD;  Location: [facility];  Service: Orthopedics;  Laterality: Right;    Family History  Problem Relation Age of Onset   Diabetes Mother    Goiter Mother    Lymphoma Mother        Deceased, 37   Colon polyps Father    Bladder Cancer Father        Deceased 82   Addison's disease Sister    Healthy Brother    Healthy Daughter    Crohn's disease Son        age 29   Colon cancer Neg Hx     Social History   Socioeconomic History   Marital status: Married    Spouse name: Not on file   Number of children: 2   Years of education: Not on file   Highest education level: Not on file  Occupational History   Occupation: Radiographer, therapeutic)  Tobacco Use   Smoking status: Never   Smokeless tobacco: Never  Substance and Sexual Activity   Alcohol use: Yes    Alcohol/week: 0.0 standard drinks    Comment: social, red wine 1 glass 3-4 drinks per week   Drug use: No   Sexual  activity: Yes    Partners: Male  Other Topics Concern   Not on file  Social History Narrative   She lives with husband.   Bedroom is on the first floor.     Education: Phd in nursing.  Has 2 grown children.   Social Determinants of Health   Financial Resource Strain: Not on file  Food Insecurity: Not on file  Transportation Needs: Not on file  Physical Activity: Not on file  Stress: Not on file  Social Connections: Not on file  Intimate Partner Violence: Not on file    Outpatient Medications Prior to Visit  Medication Sig Dispense Refill   calcium carbonate (TUMS - DOSED IN MG ELEMENTAL CALCIUM) 500 MG chewable tablet Chew 1 tablet by mouth 2 (two) times daily.     Cholecalciferol (VITAMIN D-3) 1000 UNITS CAPS Take 1 capsule by mouth every other day.     Misc Natural Products (GLUCOSAMINE CHOND CMP DOUBLE PO) Take 2 tablets by mouth at bedtime.      valsartan (DIOVAN) 160 MG tablet TAKE 1 TABLET BY MOUTH EVERY DAY 90 tablet 1   No facility-administered medications prior to visit.    Allergies  Allergen Reactions   Other Nausea And Vomiting and Other (See Comments)    Pain medications and muscle relaxers, can be given with Zofran   Tramadol Other (See Comments)    Passed out   Nsaids     GI upset, elevates blood pressure--can take toradol    Pennsaid [Diclofenac Sodium]     Elevates blood pressure   Scopace [Scopolamine] Other (See Comments)    Massive severe headache for a week   Sulfonamide Derivatives     REACTION: rash    ROS Review of Systems  Constitutional:  Negative for activity change and unexpected weight change.  Respiratory:  Negative for cough, shortness of breath and wheezing.   Cardiovascular:  Negative for palpitations and leg swelling.  Endocrine: Positive for cold intolerance.  Musculoskeletal:  Negative for arthralgias.       Objective:    Physical Exam Constitutional:      General: She is not in acute distress.    Appearance: She is normal weight. She is not ill-appearing, toxic-appearing or diaphoretic.  HENT:     Head: Normocephalic.  Cardiovascular:     Rate and Rhythm: Normal rate and regular rhythm.  Pulmonary:     Effort: Pulmonary effort is normal.  Neurological:     General: No focal deficit present.     Mental Status: She is alert and oriented to person, place, and time.  Psychiatric:        Mood and Affect: Mood normal.        Behavior: Behavior normal.        Thought Content: Thought content normal.        Judgment: Judgment normal.    BP (!) 132/96    Pulse 65    Temp 97.6 F (36.4 C) (Temporal)    Ht 5' 3.5" (1.613 m)    Wt 136 lb (61.7 kg)    SpO2 97%    BMI 23.71 kg/m  Wt Readings from Last 3 Encounters:  07/18/21 136 lb (61.7 kg)  09/21/20 136 lb (61.7 kg)  08/11/20 135 lb (61.2 kg)     There are no preventive care reminders to display for this patient.   There  are no preventive care reminders to display for this patient.  Lab Results  Component Value Date  TSH 3.30 09/21/2020   Lab Results  Component Value Date   WBC 6.1 09/21/2020   HGB 13.5 09/21/2020   HCT 39.6 09/21/2020   MCV 88.6 09/21/2020   PLT 274.0 09/21/2020   Lab Results  Component Value Date   NA 138 09/21/2020   K 4.3 09/21/2020   CO2 30 09/21/2020   GLUCOSE 87 09/21/2020   BUN 13 09/21/2020   CREATININE 0.70 09/21/2020   BILITOT 0.5 09/21/2020   ALKPHOS 78 09/21/2020   AST 14 09/21/2020   ALT 13 09/21/2020   PROT 6.9 09/21/2020   ALBUMIN 4.4 09/21/2020   CALCIUM 9.8 09/21/2020   ANIONGAP 3 (L) 09/18/2014   GFR 94.53 09/21/2020   Lab Results  Component Value Date   CHOL 180 09/21/2020   Lab Results  Component Value Date   HDL 55.90 09/21/2020   Lab Results  Component Value Date   LDLCALC 104 (H) 09/21/2020   Lab Results  Component Value Date   TRIG 102.0 09/21/2020   Lab Results  Component Value Date   CHOLHDL 3 09/21/2020   No results found for: HGBA1C    Assessment & Plan:   Problem List Items Addressed This Visit       Cardiovascular and Mediastinum   HTN (hypertension), benign    Continue daily valsartan as prescribed maintain a low-sodium diet, continue to monitor blood pressure periodically and as needed.        Musculoskeletal and Integument   Primary osteoarthritis of right knee    Continue with symptom management as necessary.  Exercise as tolerated        Other   Abnormal thyroid blood test    Will order TSH today pending results.      Relevant Orders   TSH   Hyponatremia    Lab draw today pending results      Relevant Orders   Comprehensive metabolic panel   Hyperlipidemia - Primary    Lipid panel ordered for today pending results continue to work on low-cholesterol diet and exercise as tolerated      Relevant Orders   Lipid Profile    No orders of the defined types were placed in this  encounter.   Follow-up: Return in about 1 year (around 07/18/2022).    Eugenia Pancoast, FNP

## 2021-07-18 NOTE — Assessment & Plan Note (Signed)
Lipid panel ordered for today pending results continue to work on low-cholesterol diet and exercise as tolerated

## 2021-10-08 ENCOUNTER — Other Ambulatory Visit: Payer: Self-pay | Admitting: Family

## 2021-10-08 DIAGNOSIS — I10 Essential (primary) hypertension: Secondary | ICD-10-CM

## 2022-02-08 LAB — HM MAMMOGRAPHY

## 2022-03-06 ENCOUNTER — Ambulatory Visit: Payer: BC Managed Care – PPO

## 2022-03-06 ENCOUNTER — Ambulatory Visit (INDEPENDENT_AMBULATORY_CARE_PROVIDER_SITE_OTHER): Payer: BC Managed Care – PPO

## 2022-03-06 DIAGNOSIS — Z111 Encounter for screening for respiratory tuberculosis: Secondary | ICD-10-CM

## 2022-03-06 NOTE — Progress Notes (Signed)
PPD test placed in Left Forearm. Pt needed for work.

## 2022-03-09 LAB — TB SKIN TEST
Induration: 0 mm
TB Skin Test: NEGATIVE

## 2022-04-04 ENCOUNTER — Other Ambulatory Visit: Payer: Self-pay | Admitting: Family

## 2022-04-04 DIAGNOSIS — I10 Essential (primary) hypertension: Secondary | ICD-10-CM

## 2022-05-08 ENCOUNTER — Telehealth: Payer: Self-pay | Admitting: Family

## 2022-05-08 NOTE — Telephone Encounter (Signed)
Patient is scheduled for her cpe on 05/30/22,she would like to know could she have her labs done a week prior instead of same day?

## 2022-05-09 NOTE — Telephone Encounter (Signed)
Just an FYI

## 2022-05-09 NOTE — Telephone Encounter (Signed)
Yes,I'm aware of that ,however patient wanted me to ask. I will let patient know she has to get them done the day of. Thank you.

## 2022-05-10 ENCOUNTER — Other Ambulatory Visit: Payer: Self-pay | Admitting: Family

## 2022-05-10 DIAGNOSIS — D721 Eosinophilia, unspecified: Secondary | ICD-10-CM

## 2022-05-10 DIAGNOSIS — E785 Hyperlipidemia, unspecified: Secondary | ICD-10-CM

## 2022-05-10 DIAGNOSIS — I10 Essential (primary) hypertension: Secondary | ICD-10-CM

## 2022-05-10 NOTE — Telephone Encounter (Signed)
Thanks [employer]  This is actually Dr. Alla German wife, I am ok with ordering future labs as I know she will keep her appt. Future labs have been placed if she would like to make a lab only appt, remind her to be fasting.

## 2022-05-10 NOTE — Telephone Encounter (Signed)
Appt scheduled

## 2022-05-24 ENCOUNTER — Other Ambulatory Visit (INDEPENDENT_AMBULATORY_CARE_PROVIDER_SITE_OTHER): Payer: BC Managed Care – PPO

## 2022-05-24 DIAGNOSIS — I10 Essential (primary) hypertension: Secondary | ICD-10-CM | POA: Diagnosis not present

## 2022-05-24 DIAGNOSIS — E785 Hyperlipidemia, unspecified: Secondary | ICD-10-CM

## 2022-05-24 DIAGNOSIS — D721 Eosinophilia, unspecified: Secondary | ICD-10-CM | POA: Diagnosis not present

## 2022-05-24 LAB — LIPID PANEL
Cholesterol: 201 mg/dL — ABNORMAL HIGH (ref 0–200)
HDL: 56.7 mg/dL (ref >39.00)
LDL Cholesterol: 129 mg/dL — ABNORMAL HIGH (ref 0–99)
NonHDL: 143.83
Total CHOL/HDL Ratio: 4
Triglycerides: 73 mg/dL (ref 0.0–149.0)
VLDL: 14.6 mg/dL (ref 0.0–40.0)

## 2022-05-24 LAB — BASIC METABOLIC PANEL
BUN: 13 mg/dL (ref 6–23)
CO2: 29 mEq/L (ref 19–32)
Calcium: 9 mg/dL (ref 8.4–10.5)
Chloride: 105 mEq/L (ref 96–112)
Creatinine, Ser: 0.64 mg/dL (ref 0.40–1.20)
GFR: 95.47 mL/min (ref >60.00)
Glucose, Bld: 84 mg/dL (ref 70–99)
Potassium: 4.8 mEq/L (ref 3.5–5.1)
Sodium: 140 mEq/L (ref 135–145)

## 2022-05-24 LAB — CBC WITH DIFFERENTIAL/PLATELET
Basophils Absolute: 0 10*3/uL (ref 0.0–0.1)
Basophils Relative: 0.5 % (ref 0.0–3.0)
Eosinophils Absolute: 0.4 10*3/uL (ref 0.0–0.7)
Eosinophils Relative: 7.6 % — ABNORMAL HIGH (ref 0.0–5.0)
HCT: 38.8 % (ref 36.0–46.0)
Hemoglobin: 12.9 g/dL (ref 12.0–15.0)
Lymphocytes Relative: 30.8 % (ref 12.0–46.0)
Lymphs Abs: 1.7 10*3/uL (ref 0.7–4.0)
MCHC: 33.3 g/dL (ref 30.0–36.0)
MCV: 89 fl (ref 78.0–100.0)
Monocytes Absolute: 0.4 10*3/uL (ref 0.1–1.0)
Monocytes Relative: 7.5 % (ref 3.0–12.0)
Neutro Abs: 3 10*3/uL (ref 1.4–7.7)
Neutrophils Relative %: 53.6 % (ref 43.0–77.0)
Platelets: 253 10*3/uL (ref 150.0–400.0)
RBC: 4.36 Mil/uL (ref 3.87–5.11)
RDW: 13.6 % (ref 11.5–15.5)
WBC: 5.7 10*3/uL (ref 4.0–10.5)

## 2022-05-24 LAB — TSH: TSH: 2.67 u[IU]/mL (ref 0.35–5.50)

## 2022-05-29 ENCOUNTER — Ambulatory Visit (INDEPENDENT_AMBULATORY_CARE_PROVIDER_SITE_OTHER): Payer: BC Managed Care – PPO | Admitting: Family

## 2022-05-29 ENCOUNTER — Encounter: Payer: BC Managed Care – PPO | Admitting: Family

## 2022-05-29 ENCOUNTER — Encounter: Payer: Self-pay | Admitting: Family

## 2022-05-29 VITALS — BP 130/84 | HR 78 | Temp 98.2°F | Resp 16 | Ht 63.5 in | Wt 134.4 lb

## 2022-05-29 DIAGNOSIS — R7689 Other specified abnormal immunological findings in serum: Secondary | ICD-10-CM

## 2022-05-29 DIAGNOSIS — I10 Essential (primary) hypertension: Secondary | ICD-10-CM

## 2022-05-29 DIAGNOSIS — R768 Other specified abnormal immunological findings in serum: Secondary | ICD-10-CM

## 2022-05-29 DIAGNOSIS — R898 Other abnormal findings in specimens from other organs, systems and tissues: Secondary | ICD-10-CM | POA: Insufficient documentation

## 2022-05-29 DIAGNOSIS — M2550 Pain in unspecified joint: Secondary | ICD-10-CM | POA: Diagnosis not present

## 2022-05-29 DIAGNOSIS — Z0001 Encounter for general adult medical examination with abnormal findings: Secondary | ICD-10-CM

## 2022-05-29 DIAGNOSIS — E785 Hyperlipidemia, unspecified: Secondary | ICD-10-CM

## 2022-05-29 LAB — SEDIMENTATION RATE: Sed Rate: 5 mm/hr (ref 0–30)

## 2022-05-29 NOTE — Assessment & Plan Note (Signed)
Stable, slightly elevated. <1500.  Will consider RA, ordering RF and ANA. Strong fmh and symptomatic for RA vs OA

## 2022-05-29 NOTE — Progress Notes (Signed)
Established Patient Office Visit  Subjective:  Patient ID: Sharon Riddle, female    DOB: [birth year]  Age: 62 y.o. MRN: [id number]  CC:  Chief Complaint  Patient presents with   Annual Exam    HPI Sharon Riddle is here for annual CPE.   Bone density: refuses for now.  Pap: 01/27/20, next due 2024  Mammogram, utd. 02/08/22.  Colonoscopy: utd, 5//25/2016.  Tdap: 09/21/20 Flu vaccine: completed 04/10/22  RSV: vaccine, declines for now.  Dental": up to date with biannual screenings Eye exam: annually, utd   chronic concerns:  Elevated eosinophils: mild, stable at 7.6 denies Gi concerns, no rash, no asthmatic symptoms. Does have polyarthralgia at times. RF was slightly elevated back in 2015, ana was negative. She does have mild stiffness in hands <1 hour, and sometimes bil knees but with osteoarthritis as well. Sister with addisons disease, son with h/o chron's disease.   HTN: controlled with valsartan 160 mg once daily.   Hyperlipidemia: improvement from 10 months ago.  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   This is at L5 level   Vertigo    hx of    Past Surgical History:  Procedure Laterality Date   COLONOSCOPY     KNEE ARTHROSCOPY  04/18/2012   Procedure: ARTHROSCOPY KNEE;  Surgeon: Johnny Bridge, MD;  Location: [facility];  Service: Orthopedics;  Laterality: Right;  Removal of Loose Bodies-2   TONSILLECTOMY     TOTAL HIP ARTHROPLASTY Left 05/13/2014   Procedure: LEFT TOTAL HIP  ARTHROPLASTY;  Surgeon: Johnny Bridge, MD;  Location: [facility];  Service: Orthopedics;  Laterality: Left;   TOTAL HIP ARTHROPLASTY Right 09/17/2014   Procedure: RIGHT TOTAL HIP ARTHROPLASTY ANTERIOR APPROACH;  Surgeon: Mcarthur Rossetti, MD;  Location: [facility];  Service: Orthopedics;  Laterality: Right;    Family History  Problem Relation Age of Onset   Diabetes Mother    Goiter Mother    Lymphoma Mother        Deceased, 8   Colon polyps Father    Bladder Cancer Father        Deceased 56   Addison's disease Sister    Healthy Brother    Diabetes type I Maternal Grandmother    Healthy Daughter    Crohn's disease Son        age 25   Colon cancer Neg Hx     Social History   Socioeconomic History   Marital status: Married    Spouse name: Not on file   Number of children: 2   Years of education: Not on file   Highest education level: Not on file  Occupational History   Occupation: Radiographer, therapeutic)    Employer: [employer]  Tobacco Use   Smoking status: Never   Smokeless tobacco: Never  Vaping Use   Vaping Use: Never used  Substance and Sexual Activity  Alcohol use: Yes    Alcohol/week: 2.0 standard drinks of alcohol    Types: 2 Glasses of wine per week    Comment: weekends typically   Drug use: No   Sexual activity: Yes    Partners: Male    Birth control/protection: Post-menopausal  Other Topics Concern   Not on file  Social History Narrative   She lives with husband.   Bedroom is on the first floor.     Education: Phd in nursing.  Has 2 grown children.   Social Determinants of Health   Financial Resource Strain: Not on file  Food Insecurity: Not on file  Transportation Needs: Not on file  Physical Activity: Not on file  Stress: Not on file  Social Connections: Not on file  Intimate Partner Violence: Not on file    Outpatient Medications Prior to Visit  Medication Sig Dispense Refill   calcium carbonate (TUMS - DOSED IN MG ELEMENTAL CALCIUM)  500 MG chewable tablet Chew 1 tablet by mouth 2 (two) times daily.     Cholecalciferol (VITAMIN D-3) 1000 UNITS CAPS Take 1 capsule by mouth every other day.     Misc Natural Products (GLUCOSAMINE CHOND CMP DOUBLE PO) Take 2 tablets by mouth at bedtime.     valsartan (DIOVAN) 160 MG tablet TAKE 1 TABLET BY MOUTH EVERY DAY 90 tablet 1   No facility-administered medications prior to visit.    Allergies  Allergen Reactions   Other Nausea And Vomiting and Other (See Comments)    Pain medications and muscle relaxers, can be given with Zofran   Tramadol Other (See Comments)    Passed out   Nsaids     GI upset, elevates blood pressure--can take toradol    Pennsaid [Diclofenac Sodium]     Elevates blood pressure   Scopace [Scopolamine] Other (See Comments)    Massive severe headache for a week   Sulfonamide Derivatives     REACTION: rash    ROS Review of Systems  Review of Systems  Respiratory:  Negative for shortness of breath.   Cardiovascular:  Negative for chest pain and palpitations.  Gastrointestinal:  Negative for constipation and diarrhea.  Genitourinary:  Negative for dysuria, frequency and urgency.  Musculoskeletal:  Negative for myalgias. Athralgias slight in am.  Psychiatric/Behavioral:  Negative for depression and suicidal ideas.   All other systems reviewed and are negative.    Objective:    Physical Exam Vitals reviewed.  Constitutional:      General: She is not in acute distress.    Appearance: Normal appearance. She is not ill-appearing or toxic-appearing.  HENT:     Right Ear: Tympanic membrane normal.     Left Ear: Tympanic membrane normal.     Mouth/Throat:     Mouth: Mucous membranes are moist.     Pharynx: No pharyngeal swelling.     Tonsils: No tonsillar exudate.  Eyes:     Extraocular Movements: Extraocular movements intact.     Conjunctiva/sclera: Conjunctivae normal.     Pupils: Pupils are equal, round, and reactive to light.  Neck:      Thyroid: No thyroid mass.  Cardiovascular:     Rate and Rhythm: Normal rate and regular rhythm.  Pulmonary:     Effort: Pulmonary effort is normal.     Breath sounds: Normal breath sounds.  Abdominal:     General: Abdomen is flat. Bowel sounds are normal.     Palpations: Abdomen is soft.  Musculoskeletal:  General: Normal range of motion.     Right hand: Swelling (middle phalynx 2rd metacarpal edema) and deformity (heberden nodes medial aspect) present. No tenderness. Normal range of motion. Normal strength.     Left hand: Swelling and deformity (heberden node medial aspect distal phalynx) present. No tenderness. Normal range of motion. Normal strength.  Lymphadenopathy:     Cervical:     Right cervical: No superficial cervical adenopathy.    Left cervical: No superficial cervical adenopathy.  Skin:    General: Skin is warm.     Capillary Refill: Capillary refill takes less than 2 seconds.  Neurological:     General: No focal deficit present.     Mental Status: She is alert and oriented to person, place, and time.  Psychiatric:        Mood and Affect: Mood normal.        Behavior: Behavior normal.        Thought Content: Thought content normal.        Judgment: Judgment normal.     BP 130/84   Pulse 78   Temp 98.2 F (36.8 C)   Resp 16   Ht 5' 3.5" (1.613 m)   Wt 134 lb 6 oz (61 kg)   SpO2 100%   BMI 23.43 kg/m  Wt Readings from Last 3 Encounters:  05/29/22 134 lb 6 oz (61 kg)  07/18/21 136 lb (61.7 kg)  09/21/20 136 lb (61.7 kg)     There are no preventive care reminders to display for this patient.  There are no preventive care reminders to display for this patient.  Lab Results  Component Value Date   TSH 2.67 05/24/2022   Lab Results  Component Value Date   WBC 5.7 05/24/2022   HGB 12.9 05/24/2022   HCT 38.8 05/24/2022   MCV 89.0 05/24/2022   PLT 253.0 05/24/2022   Lab Results  Component Value Date   NA 140 05/24/2022   K 4.8 05/24/2022    CO2 29 05/24/2022   GLUCOSE 84 05/24/2022   BUN 13 05/24/2022   CREATININE 0.64 05/24/2022   BILITOT 0.7 07/18/2021   ALKPHOS 72 07/18/2021   AST 16 07/18/2021   ALT 13 07/18/2021   PROT 7.1 07/18/2021   ALBUMIN 4.4 07/18/2021   CALCIUM 9.0 05/24/2022   ANIONGAP 3 (L) 09/18/2014   GFR 95.47 05/24/2022   Lab Results  Component Value Date   CHOL 201 (H) 05/24/2022   Lab Results  Component Value Date   HDL 56.70 05/24/2022   Lab Results  Component Value Date   LDLCALC 129 (H) 05/24/2022   Lab Results  Component Value Date   TRIG 73.0 05/24/2022   Lab Results  Component Value Date   CHOLHDL 4 05/24/2022   No results found for: "HGBA1C"    Assessment & Plan:   Problem List Items Addressed This Visit       Cardiovascular and Mediastinum   HTN (hypertension), benign    Continue valsartan 160 mg once daily. Advise for low sodium diet.        Other   Encounter for general adult medical examination with abnormal findings (Chronic)    Patient Counseling(The following topics were reviewed):  Preventative care handout given to pt  Health maintenance and immunizations reviewed. Please refer to Health maintenance section. Pt advised on safe sex, wearing seatbelts in car, and proper nutrition labwork ordered today for annual Dental health: Discussed importance of regular tooth brushing, flossing, and dental visits.  Hyperlipidemia    Reviewed, improved from last lab work.  Advised to continue to work on low chol diet exercise as tolerated.      Elevated rheumatoid factor    Repeat rf, order ana and sed rate Pending results R/o RA      Relevant Orders   Rheumatoid factor   Eosinophils increased    Stable, slightly elevated. <1500.  Will consider RA, ordering RF and ANA. Strong fmh and symptomatic for RA vs OA      Other Visit Diagnoses     Polyarthralgia    -  Primary   Relevant Orders   ANA   Sedimentation rate (Completed)   Rheumatoid factor        No orders of the defined types were placed in this encounter.   Follow-up: Return in about 1 year (around 05/30/2023) for f/u CPE.    Eugenia Pancoast, FNP

## 2022-05-29 NOTE — Assessment & Plan Note (Signed)
Reviewed, improved from last lab work.  Advised to continue to work on low chol diet exercise as tolerated.

## 2022-05-29 NOTE — Assessment & Plan Note (Signed)
Patient Counseling(The following topics were reviewed): ? Preventative care handout given to pt  ?Health maintenance and immunizations reviewed. Please refer to Health maintenance section. ?Pt advised on safe sex, wearing seatbelts in car, and proper nutrition ?labwork ordered today for annual ?Dental health: Discussed importance of regular tooth brushing, flossing, and dental visits. ? ? ?

## 2022-05-29 NOTE — Assessment & Plan Note (Signed)
Repeat rf, order ana and sed rate Pending results R/o RA

## 2022-05-29 NOTE — Patient Instructions (Signed)
  Stop by the lab prior to leaving today. I will notify you of your results once received.   Recommendations on keeping yourself healthy:  - Exercise at least 30-45 minutes a day, 3-4 days a week.  - Eat a low-fat diet with lots of fruits and vegetables, up to 7-9 servings per day.  - Seatbelts can save your life. Wear them always.  - Smoke detectors on every level of your home, check batteries every year.  - Eye Doctor - have an eye exam every 1-2 years  - Safe sex - if you may be exposed to STDs, use a condom.  - Alcohol -  If you drink, do it moderately, less than 2 drinks per day.  - [place]. Choose someone to speak for you if you are not able.  - Depression is common in our stressful world.If you're feeling down or losing interest in things you normally enjoy, please come in for a visit.  - Violence - If anyone is threatening or hurting you, please call immediately.  Due to recent changes in healthcare laws, you may see results of your imaging and/or laboratory studies on MyChart before I have had a chance to review them.  I understand that in some cases there may be results that are confusing or concerning to you. Please understand that not all results are received at the same time and often I may need to interpret multiple results in order to provide you with the best plan of care or course of treatment. Therefore, I ask that you please give me 2 business days to thoroughly review all your results before contacting my office for clarification. Should we see a critical lab result, you will be contacted sooner.   I will see you again in one year for your annual comprehensive exam unless otherwise stated and or with acute concerns.  It was a pleasure seeing you today! Please do not hesitate to reach out with any questions and or concerns.  Regards,   Eugenia Pancoast

## 2022-05-29 NOTE — Assessment & Plan Note (Signed)
Continue valsartan 160 mg once daily. Advise for low sodium diet.

## 2022-05-30 ENCOUNTER — Encounter: Payer: BC Managed Care – PPO | Admitting: Family

## 2022-05-30 LAB — RHEUMATOID FACTOR: Rheumatoid fact SerPl-aCnc: <14 IU/mL (ref <14)

## 2022-06-01 LAB — ANA: Anti Nuclear Antibody (ANA): NEGATIVE

## 2022-06-15 ENCOUNTER — Other Ambulatory Visit: Payer: Self-pay | Admitting: Family

## 2022-06-15 ENCOUNTER — Other Ambulatory Visit: Payer: Self-pay

## 2022-06-15 DIAGNOSIS — T753XXA Motion sickness, initial encounter: Secondary | ICD-10-CM

## 2022-06-15 MED ORDER — MECLIZINE HCL 25 MG PO TABS
25.0000 mg | ORAL_TABLET | Freq: Two times a day (BID) | ORAL | 0 refills | Status: AC | PRN
  Filled 2022-06-15: qty 20, 10d supply, fill #0

## 2022-07-30 ENCOUNTER — Telehealth: Payer: Self-pay

## 2022-07-30 DIAGNOSIS — I10 Essential (primary) hypertension: Secondary | ICD-10-CM

## 2022-07-30 NOTE — Telephone Encounter (Signed)
Requesting refill on Valsartan '160mg'$  to be sent to [facility].

## 2022-07-31 ENCOUNTER — Other Ambulatory Visit: Payer: Self-pay

## 2022-07-31 DIAGNOSIS — I10 Essential (primary) hypertension: Secondary | ICD-10-CM

## 2022-07-31 MED ORDER — VALSARTAN 160 MG PO TABS
160.0000 mg | ORAL_TABLET | Freq: Every day | ORAL | 3 refills | Status: DC
Start: 2022-07-31 — End: 2022-07-31

## 2022-07-31 MED ORDER — VALSARTAN 160 MG PO TABS
160.0000 mg | ORAL_TABLET | Freq: Every day | ORAL | 3 refills | Status: DC
  Filled 2022-07-31 – 2022-08-01 (×2): qty 90, 90d supply, fill #0
  Filled 2022-10-25: qty 90, 90d supply, fill #1
  Filled 2023-01-23 – 2023-01-24 (×2): qty 90, 90d supply, fill #2
  Filled 2023-04-23: qty 90, 90d supply, fill #3

## 2022-07-31 NOTE — Addendum Note (Signed)
Addended by: Eugenia Pancoast on: 07/31/2022 07:38 AM   Modules accepted: Orders

## 2022-07-31 NOTE — Telephone Encounter (Signed)
Refilled

## 2022-08-01 ENCOUNTER — Other Ambulatory Visit: Payer: Self-pay

## 2022-12-04 ENCOUNTER — Other Ambulatory Visit: Payer: Self-pay

## 2022-12-04 MED ORDER — COVID-19 MRNA VAC-TRIS(PFIZER) 30 MCG/0.3ML IM SUSY
0.3000 mL | PREFILLED_SYRINGE | Freq: Once | INTRAMUSCULAR | 0 refills | Status: AC
  Filled 2022-12-04: qty 0.3, 1d supply, fill #0

## 2023-01-02 ENCOUNTER — Other Ambulatory Visit: Payer: Self-pay | Admitting: Family

## 2023-01-02 ENCOUNTER — Other Ambulatory Visit: Payer: Self-pay

## 2023-01-02 DIAGNOSIS — T753XXA Motion sickness, initial encounter: Secondary | ICD-10-CM

## 2023-01-02 MED ORDER — MECLIZINE HCL 25 MG PO TABS
25.0000 mg | ORAL_TABLET | Freq: Three times a day (TID) | ORAL | 0 refills | Status: AC | PRN
  Filled 2023-01-02: qty 60, 20d supply, fill #0

## 2023-01-24 ENCOUNTER — Other Ambulatory Visit: Payer: Self-pay

## 2023-03-27 ENCOUNTER — Other Ambulatory Visit: Payer: Self-pay

## 2023-03-27 MED ORDER — FLULAVAL 0.5 ML IM SUSY
0.5000 mL | PREFILLED_SYRINGE | Freq: Once | INTRAMUSCULAR | 0 refills | Status: AC
Start: 2023-03-27 — End: 2023-03-28
  Filled 2023-03-27: qty 0.5, 1d supply, fill #0

## 2023-03-27 MED ORDER — COVID-19 MRNA VAC-TRIS(PFIZER) 30 MCG/0.3ML IM SUSY
0.3000 mL | PREFILLED_SYRINGE | Freq: Once | INTRAMUSCULAR | 0 refills | Status: AC
  Filled 2023-03-27: qty 0.3, 1d supply, fill #0

## 2023-04-03 ENCOUNTER — Other Ambulatory Visit: Payer: Self-pay

## 2023-07-04 ENCOUNTER — Encounter: Payer: Self-pay | Admitting: Family

## 2023-07-04 ENCOUNTER — Ambulatory Visit: Payer: BC Managed Care – PPO | Admitting: Family

## 2023-07-04 VITALS — BP 122/80 | HR 76 | Temp 97.8°F | Ht 63.5 in | Wt 138.0 lb

## 2023-07-04 DIAGNOSIS — I10 Essential (primary) hypertension: Secondary | ICD-10-CM | POA: Diagnosis not present

## 2023-07-04 DIAGNOSIS — R7989 Other specified abnormal findings of blood chemistry: Secondary | ICD-10-CM

## 2023-07-04 DIAGNOSIS — E782 Mixed hyperlipidemia: Secondary | ICD-10-CM

## 2023-07-04 DIAGNOSIS — D721 Eosinophilia, unspecified: Secondary | ICD-10-CM | POA: Diagnosis not present

## 2023-07-04 DIAGNOSIS — Z0001 Encounter for general adult medical examination with abnormal findings: Secondary | ICD-10-CM | POA: Diagnosis not present

## 2023-07-04 DIAGNOSIS — R768 Other specified abnormal immunological findings in serum: Secondary | ICD-10-CM

## 2023-07-04 DIAGNOSIS — Z1589 Genetic susceptibility to other disease: Secondary | ICD-10-CM | POA: Diagnosis not present

## 2023-07-04 DIAGNOSIS — Z87728 Personal history of other specified (corrected) congenital malformations of nervous system and sense organs: Secondary | ICD-10-CM | POA: Insufficient documentation

## 2023-07-04 DIAGNOSIS — R898 Other abnormal findings in specimens from other organs, systems and tissues: Secondary | ICD-10-CM

## 2023-07-04 LAB — CBC
HCT: 40.5 % (ref 36.0–46.0)
Hemoglobin: 13.6 g/dL (ref 12.0–15.0)
MCHC: 33.6 g/dL (ref 30.0–36.0)
MCV: 90.3 fL (ref 78.0–100.0)
Platelets: 277 10*3/uL (ref 150.0–400.0)
RBC: 4.48 Mil/uL (ref 3.87–5.11)
RDW: 13.7 % (ref 11.5–15.5)
WBC: 8.1 10*3/uL (ref 4.0–10.5)

## 2023-07-04 LAB — BASIC METABOLIC PANEL
BUN: 12 mg/dL (ref 6–23)
CO2: 27 meq/L (ref 19–32)
Calcium: 9.4 mg/dL (ref 8.4–10.5)
Chloride: 99 meq/L (ref 96–112)
Creatinine, Ser: 0.6 mg/dL (ref 0.40–1.20)
GFR: 96.21 mL/min (ref >60.00)
Glucose, Bld: 98 mg/dL (ref 70–99)
Potassium: 4.4 meq/L (ref 3.5–5.1)
Sodium: 137 meq/L (ref 135–145)

## 2023-07-04 LAB — B12 AND FOLATE PANEL
Folate: >24.2 ng/mL (ref >5.9)
Vitamin B-12: 253 pg/mL (ref 211–911)

## 2023-07-04 LAB — LIPID PANEL
Cholesterol: 208 mg/dL — ABNORMAL HIGH (ref 0–200)
HDL: 61.6 mg/dL (ref >39.00)
LDL Cholesterol: 123 mg/dL — ABNORMAL HIGH (ref 0–99)
NonHDL: 146.2
Total CHOL/HDL Ratio: 3
Triglycerides: 118 mg/dL (ref 0.0–149.0)
VLDL: 23.6 mg/dL (ref 0.0–40.0)

## 2023-07-04 LAB — TSH: TSH: 5.1 u[IU]/mL (ref 0.35–5.50)

## 2023-07-04 NOTE — Progress Notes (Signed)
Subjective:  Patient ID: Sharon Riddle, female    DOB: 11/29/1960  Age: 62 y.o. MRN: [id number]  Patient Care Team: Mort Sawyers, FNP as PCP - General (Family Medicine) Obgyn, Ma Hillock   CC:  Chief Complaint  Patient presents with   Annual Exam    HPI Sharon Riddle is a 62 y.o. female who presents today for an annual physical exam. She reports consuming a  pescatarian  diet.  Very active walks 3-6 miles a day  She generally feels well. She reports sleeping well. She does not have additional problems to discuss today.   Vision:Within last year Dental:Receives regular dental care  Mammogram: 01/3023 Last pap: feb 2025 with gynecology  Colonoscopy: 12/08/2014 Bone density scan:  Pt is without acute concerns.   Recently had genetic testing and she has MTHFR and they recommended a homocysteine level. She was spina bifida occulta when she was born. Also was told the joints in her collagen levels were low, and she has high endurance ability so exercises a lot which can lead to joint injuries of which she was told. She also was positive for ACE variant. Neg for alzheimers and insulin resistance risk was negative. She was positive for chrohns variant as well of which she states that her son had crohns.   Elevated LDL: pt open ot treating as she is positive for ACE variant.   Heberden's node, starting to worsen, hard for her at times to make a fist due to stiffness.   Was sick a few weeks ago, still with residual sinus pressure but she states it is improving on its own.   Advanced Directives Patient does have advanced directives including DNR, DNR only on the operating table. She does not have a copy in the electronic medical record.   DEPRESSION SCREENING    07/04/2023   10:13 AM 09/21/2020    3:10 PM 07/01/2019   11:55 AM 05/26/2018    2:41 PM 05/07/2017   10:24 AM  PHQ 2/9 Scores  PHQ - 2 Score 0 0 0 0 0     ROS: Negative unless specifically indicated above in HPI.     Current Outpatient Medications:    CO-ENZYME Q-10 PO, , Disp: , Rfl:    Emollient (COLLAGEN EX), , Disp: , Rfl:    Misc Natural Products (GLUCOSAMINE CHOND CMP DOUBLE PO), Take 2 tablets by mouth at bedtime., Disp: , Rfl:    Multiple Vitamins-Minerals (CENTRUM SILVER 50+WOMEN PO), , Disp: , Rfl:    valsartan (DIOVAN) 160 MG tablet, Take 1 tablet (160 mg total) by mouth daily., Disp: 90 tablet, Rfl: 3   atorvastatin (LIPITOR) 10 MG tablet, Take 1 tablet (10 mg total) by mouth daily., Disp: 90 tablet, Rfl: 3    Objective:    BP 122/80   Pulse 76   Temp 97.8 F (36.6 C) (Temporal)   Ht 5' 3.5" (1.613 m)   Wt 138 lb (62.6 kg)   SpO2 97%   BMI 24.06 kg/m   BP Readings from Last 3 Encounters:  07/04/23 122/80  05/29/22 130/84  07/18/21 (!) 132/96      Physical Exam Constitutional:      General: She is not in acute distress.    Appearance: Normal appearance. She is normal weight. She is not ill-appearing.  HENT:     Head: Normocephalic.     Right Ear: Tympanic membrane normal.     Left Ear: Tympanic membrane normal.     Nose: Nose normal.  Mouth/Throat:     Mouth: Mucous membranes are moist.  Eyes:     Extraocular Movements: Extraocular movements intact.     Pupils: Pupils are equal, round, and reactive to light.  Cardiovascular:     Rate and Rhythm: Normal rate and regular rhythm.  Pulmonary:     Effort: Pulmonary effort is normal.     Breath sounds: Normal breath sounds.  Abdominal:     General: Abdomen is flat. Bowel sounds are normal.     Palpations: Abdomen is soft.     Tenderness: There is no guarding or rebound.  Musculoskeletal:        General: Normal range of motion.     Cervical back: Normal range of motion.     Comments: Right 5th middle pharynx with bony prominence and mild tenderness as well as third and second metacarpal as well.   Slight curvature distal phalynx 2nd metacarpal  Skin:    General: Skin is warm.     Capillary Refill: Capillary  refill takes less than 2 seconds.  Neurological:     General: No focal deficit present.     Mental Status: She is alert.  Psychiatric:        Mood and Affect: Mood normal.        Behavior: Behavior normal.        Thought Content: Thought content normal.        Judgment: Judgment normal.           Assessment & Plan:  MTHFR gene mutation Assessment & Plan: New finding.  Ordering homocysteine and folic acid pending results.   Orders: -     Homocysteine -     B12 and Folate Panel  History of spina bifida -     Homocysteine  Eosinophilia, unspecified type -     CBC  Mixed hyperlipidemia Assessment & Plan: Ordered lipid panel, pending results. Work on low cholesterol diet and exercise as tolerated   Orders: -     Lipid panel  Encounter for general adult medical examination with abnormal findings Assessment & Plan: Patient Counseling(The following topics were reviewed):  Preventative care handout given to pt  Health maintenance and immunizations reviewed. Please refer to Health maintenance section. Pt advised on safe sex, wearing seatbelts in car, and proper nutrition labwork ordered today for annual Dental health: Discussed importance of regular tooth brushing, flossing, and dental visits.   Orders: -     Basic metabolic panel -     CBC -     Lipid panel -     TSH  HTN (hypertension), benign -     Basic metabolic panel  Elevated rheumatoid factor Assessment & Plan: Repeat again pending results.  Ongoing polyarthralgia with stiffness  Recommendation for compression gloves, tylenol and ibuprofen prn   Orders: -     Rheumatoid factor  Eosinophils increased  Abnormal thyroid blood test -     TSH      Follow-up: Return in about 1 year (around 07/03/2024) for f/u CPE.   Mort Sawyers, FNP

## 2023-07-05 ENCOUNTER — Encounter: Payer: Self-pay | Admitting: Family

## 2023-07-05 ENCOUNTER — Other Ambulatory Visit: Payer: Self-pay

## 2023-07-05 LAB — RHEUMATOID FACTOR: Rheumatoid fact SerPl-aCnc: <10 [IU]/mL (ref <14)

## 2023-07-05 LAB — HOMOCYSTEINE: Homocysteine: 9.3 umol/L (ref <10.4)

## 2023-07-05 MED ORDER — ATORVASTATIN CALCIUM 10 MG PO TABS
10.0000 mg | ORAL_TABLET | Freq: Every day | ORAL | 3 refills | Status: DC
  Filled 2023-07-05: qty 90, 90d supply, fill #0
  Filled 2023-09-28: qty 90, 90d supply, fill #1
  Filled 2023-12-28: qty 90, 90d supply, fill #2
  Filled 2024-03-25: qty 90, 90d supply, fill #3

## 2023-07-08 ENCOUNTER — Encounter: Payer: Self-pay | Admitting: Family

## 2023-07-10 NOTE — Assessment & Plan Note (Signed)
Ordered lipid panel, pending results. Work on low cholesterol diet and exercise as tolerated  

## 2023-07-10 NOTE — Assessment & Plan Note (Signed)
New finding.  Ordering homocysteine and folic acid pending results.

## 2023-07-10 NOTE — Assessment & Plan Note (Signed)
Repeat again pending results.  Ongoing polyarthralgia with stiffness  Recommendation for compression gloves, tylenol and ibuprofen prn

## 2023-07-10 NOTE — Assessment & Plan Note (Signed)

## 2023-07-17 ENCOUNTER — Other Ambulatory Visit: Payer: Self-pay | Admitting: Family

## 2023-07-17 DIAGNOSIS — I10 Essential (primary) hypertension: Secondary | ICD-10-CM

## 2023-07-18 ENCOUNTER — Other Ambulatory Visit: Payer: Self-pay

## 2023-07-18 MED ORDER — VALSARTAN 160 MG PO TABS
160.0000 mg | ORAL_TABLET | Freq: Every day | ORAL | 3 refills | Status: DC
  Filled 2023-07-18: qty 90, 90d supply, fill #0
  Filled 2023-10-17: qty 90, 90d supply, fill #1
  Filled 2024-01-12: qty 90, 90d supply, fill #2
  Filled 2024-04-12: qty 90, 90d supply, fill #3

## 2023-08-20 ENCOUNTER — Other Ambulatory Visit: Payer: Self-pay | Admitting: Medical Genetics

## 2023-08-23 ENCOUNTER — Other Ambulatory Visit
Admission: RE | Admit: 2023-08-23 | Discharge: 2023-08-23 | Disposition: A | Discharge status: Home / Self Care | Payer: Self-pay | Source: Ambulatory Visit | Attending: Medical Genetics | Admitting: Medical Genetics

## 2023-09-10 ENCOUNTER — Other Ambulatory Visit: Payer: Self-pay

## 2023-09-10 ENCOUNTER — Other Ambulatory Visit: Payer: Self-pay | Admitting: Family

## 2023-09-10 ENCOUNTER — Telehealth: Payer: Self-pay | Admitting: Family

## 2023-09-10 DIAGNOSIS — Z20828 Contact with and (suspected) exposure to other viral communicable diseases: Secondary | ICD-10-CM

## 2023-09-10 MED ORDER — OSELTAMIVIR PHOSPHATE 75 MG PO CAPS
75.0000 mg | ORAL_CAPSULE | Freq: Two times a day (BID) | ORAL | 0 refills | Status: AC
  Filled 2023-09-10: qty 10, 5d supply, fill #0

## 2023-09-10 NOTE — Telephone Encounter (Signed)
 Pt sneezing and slight nasal congestion, recent exposure to grandson who just tested positive for flu A. She is requesting tamiflu.   Tamiflu sent in will give 75 mg po bide x 5 days as with potential symptoms and known exposure.

## 2023-09-23 LAB — GENECONNECT MOLECULAR SCREEN: Genetic Analysis Overall Interpretation: NEGATIVE

## 2023-10-17 ENCOUNTER — Other Ambulatory Visit (HOSPITAL_COMMUNITY): Payer: Self-pay

## 2023-10-17 ENCOUNTER — Other Ambulatory Visit: Payer: Self-pay

## 2023-12-28 ENCOUNTER — Other Ambulatory Visit: Payer: Self-pay

## 2024-01-13 ENCOUNTER — Other Ambulatory Visit (HOSPITAL_COMMUNITY): Payer: Self-pay

## 2024-02-13 LAB — HM MAMMOGRAPHY

## 2024-02-17 ENCOUNTER — Encounter: Payer: Self-pay | Admitting: Family

## 2024-02-17 ENCOUNTER — Ambulatory Visit: Payer: Self-pay | Admitting: Family

## 2024-02-17 NOTE — Progress Notes (Signed)
 noted

## 2024-03-25 ENCOUNTER — Other Ambulatory Visit (HOSPITAL_COMMUNITY): Payer: Self-pay

## 2024-04-09 ENCOUNTER — Other Ambulatory Visit: Payer: Self-pay

## 2024-04-09 MED ORDER — METHYLPREDNISOLONE 4 MG PO TBPK
ORAL_TABLET | ORAL | 0 refills | Status: AC
  Filled 2024-04-09: qty 21, 6d supply, fill #0

## 2024-04-13 ENCOUNTER — Other Ambulatory Visit (HOSPITAL_COMMUNITY): Payer: Self-pay

## 2024-04-16 ENCOUNTER — Other Ambulatory Visit: Payer: Self-pay

## 2024-04-16 MED ORDER — COMIRNATY 30 MCG/0.3ML IM SUSY
0.3000 mL | PREFILLED_SYRINGE | Freq: Once | INTRAMUSCULAR | 0 refills | Status: AC
  Filled 2024-04-16: qty 0.3, 1d supply, fill #0

## 2024-04-16 MED ORDER — FLUZONE 0.5 ML IM SUSY
0.5000 mL | PREFILLED_SYRINGE | Freq: Once | INTRAMUSCULAR | 0 refills | Status: AC
  Filled 2024-04-16: qty 0.5, 1d supply, fill #0

## 2024-05-01 ENCOUNTER — Other Ambulatory Visit: Payer: Self-pay

## 2024-06-21 ENCOUNTER — Other Ambulatory Visit: Payer: Self-pay | Admitting: Family

## 2024-06-21 DIAGNOSIS — E782 Mixed hyperlipidemia: Secondary | ICD-10-CM

## 2024-06-22 ENCOUNTER — Other Ambulatory Visit: Payer: Self-pay

## 2024-06-22 ENCOUNTER — Other Ambulatory Visit (HOSPITAL_COMMUNITY): Payer: Self-pay

## 2024-06-22 MED ORDER — ATORVASTATIN CALCIUM 10 MG PO TABS
10.0000 mg | ORAL_TABLET | Freq: Every day | ORAL | 0 refills | Status: AC
  Filled 2024-06-22: qty 90, 90d supply, fill #0

## 2024-07-06 ENCOUNTER — Other Ambulatory Visit: Payer: Self-pay

## 2024-07-06 ENCOUNTER — Other Ambulatory Visit: Payer: Self-pay | Admitting: Family

## 2024-07-06 ENCOUNTER — Other Ambulatory Visit (HOSPITAL_COMMUNITY): Payer: Self-pay

## 2024-07-06 DIAGNOSIS — I10 Essential (primary) hypertension: Secondary | ICD-10-CM

## 2024-07-06 MED ORDER — VALSARTAN 160 MG PO TABS
160.0000 mg | ORAL_TABLET | Freq: Every day | ORAL | 0 refills | Status: AC
  Filled 2024-07-06: qty 90, 90d supply, fill #0

## 2024-09-10 ENCOUNTER — Encounter: Admitting: Family
